# Patient Record
Sex: Male | Born: 1975 | Race: White | Hispanic: No | Marital: Married | Smoking: Never smoker
Health system: Southern US, Community
[De-identification: ages and names within clinical notes are randomized; demographics above are authoritative.]

## PROBLEM LIST (undated history)

## (undated) DIAGNOSIS — J869 Pyothorax without fistula: Secondary | ICD-10-CM

## (undated) DIAGNOSIS — G4730 Sleep apnea, unspecified: Secondary | ICD-10-CM

## (undated) DIAGNOSIS — J189 Pneumonia, unspecified organism: Secondary | ICD-10-CM

## (undated) DIAGNOSIS — Z9889 Other specified postprocedural states: Secondary | ICD-10-CM

## (undated) HISTORY — DX: Other specified postprocedural states: Z98.890

## (undated) HISTORY — DX: Pneumonia, unspecified organism: J18.9

---

## 2013-03-25 DIAGNOSIS — J189 Pneumonia, unspecified organism: Secondary | ICD-10-CM

## 2013-03-25 HISTORY — DX: Pneumonia, unspecified organism: J18.9

## 2013-05-28 ENCOUNTER — Ambulatory Visit (INDEPENDENT_AMBULATORY_CARE_PROVIDER_SITE_OTHER): Payer: BC Managed Care – PPO | Admitting: Podiatry

## 2013-05-28 ENCOUNTER — Encounter: Payer: Self-pay | Admitting: Podiatry

## 2013-05-28 ENCOUNTER — Ambulatory Visit (INDEPENDENT_AMBULATORY_CARE_PROVIDER_SITE_OTHER): Payer: BC Managed Care – PPO

## 2013-05-28 VITALS — BP 123/87 | HR 80 | Resp 16 | Ht 74.0 in | Wt 225.0 lb

## 2013-05-28 DIAGNOSIS — M79609 Pain in unspecified limb: Secondary | ICD-10-CM

## 2013-05-28 DIAGNOSIS — M79672 Pain in left foot: Secondary | ICD-10-CM

## 2013-05-28 DIAGNOSIS — M722 Plantar fascial fibromatosis: Secondary | ICD-10-CM

## 2013-05-28 MED ORDER — DICLOFENAC SODIUM 75 MG PO TBEC: 75.0000 mg | DELAYED_RELEASE_TABLET | Freq: Two times a day (BID) | ORAL | Status: DC

## 2013-05-28 MED ORDER — TRIAMCINOLONE ACETONIDE 10 MG/ML IJ SUSP
10.0000 mg | Freq: Once | INTRAMUSCULAR | Status: AC
  Administered 2013-05-28: 10 mg

## 2013-05-28 NOTE — Progress Notes (Signed)
Subjective:     Patient ID: Adrian CookeyJohn Hogan, male   DOB: 11/17/1975, 38 y.o.   MRN: [id number]  Foot Pain   patient presents stating I am having pain in my left arch that has been present for about 4 months and getting worse over that period of time. Started as a stone bruise but is now hurting me most of the day   Review of Systems  All other systems reviewed and are negative.       Objective:   Physical Exam  Nursing note and vitals reviewed. Constitutional: He is oriented to person, place, and time.  Cardiovascular: Intact distal pulses.   Musculoskeletal: Normal range of motion.  Neurological: He is oriented to person, place, and time.  Skin: Skin is warm.   neurovascular status is intact with muscle strength adequate and no equinus condition and normal range of motion. Patient is found to have discomfort in the plantar arch left just distal to the insertion to the calcaneus with inflammation and fluid of the medial band     Assessment:     Plantar fasciitis left with inflammation and fluid of the medial band    Plan:     H&P and x-ray reviewed with patient. Injected the left plantar fascia distal to the insertion 3 mg Kenalog 5 mm I can Marcaine mixture and dispensed splint in order to lift up the arch and instructed on usage. Placed on Voltaren 75 mg twice a day and also gave instructions on physical therapy for plantar fasciitis. Reappoint one week and discussed long-term orthotic therapy

## 2013-05-28 NOTE — Patient Instructions (Signed)

## 2013-05-28 NOTE — Progress Notes (Signed)
   Subjective:    Patient ID: Adrian CookeyJohn Hogan, male    DOB: 06/23/1975, 38 y.o.   MRN: [id number]  HPI Comments: Left foot having arch pain, sharp , it started when it was just doing it in the morning now it has got to the point that it will hurt throughout the day. 3-4 months . Taking some etodolac for a couple of days. Mostly just dealing with it   Foot Pain      Review of Systems  All other systems reviewed and are negative.       Objective:   Physical Exam        Assessment & Plan:

## 2013-06-04 ENCOUNTER — Ambulatory Visit (INDEPENDENT_AMBULATORY_CARE_PROVIDER_SITE_OTHER): Payer: BC Managed Care – PPO | Admitting: Podiatry

## 2013-06-04 VITALS — BP 102/68 | HR 61 | Resp 16 | Ht 74.0 in | Wt 230.0 lb

## 2013-06-04 DIAGNOSIS — M722 Plantar fascial fibromatosis: Secondary | ICD-10-CM

## 2013-06-04 NOTE — Progress Notes (Signed)
Subjective:     Patient ID: Adrian CookeyJohn Hogan, male   DOB: 11/13/1975, 38 y.o.   MRN: [id number]  HPI patient presents stating my foot feels better but it feels so good having support from the brace   Review of Systems     Objective:   Physical Exam Neurovascular status intact with pain in the mid arch region been significantly improved from previous visit    Assessment:     Plantar fasciitis improve left the chronic nature to his deformity with mechanical dysfunction of arch noted    Plan:     Reviewed condition and recommended orthotics with review of physical therapy. Scanned for custom orthotics at this time and will be seen back when they are ready

## 2013-06-17 ENCOUNTER — Encounter: Payer: Self-pay | Admitting: *Deleted

## 2013-06-17 NOTE — Progress Notes (Signed)
Sent pt post card letting him know orthotics are here. 

## 2013-06-18 ENCOUNTER — Ambulatory Visit (INDEPENDENT_AMBULATORY_CARE_PROVIDER_SITE_OTHER): Payer: BC Managed Care – PPO | Admitting: *Deleted

## 2013-06-18 DIAGNOSIS — M722 Plantar fascial fibromatosis: Secondary | ICD-10-CM

## 2013-06-18 NOTE — Patient Instructions (Signed)

## 2013-06-18 NOTE — Progress Notes (Signed)
Pt presents for orthotics pick up, written and verbal instructions given

## 2013-07-09 ENCOUNTER — Ambulatory Visit: Payer: BC Managed Care – PPO | Admitting: Podiatry

## 2013-07-16 ENCOUNTER — Ambulatory Visit: Payer: BC Managed Care – PPO | Admitting: Podiatry

## 2013-08-10 ENCOUNTER — Ambulatory Visit (INDEPENDENT_AMBULATORY_CARE_PROVIDER_SITE_OTHER): Payer: BC Managed Care – PPO | Admitting: Podiatry

## 2013-08-10 ENCOUNTER — Telehealth: Payer: Self-pay | Admitting: Podiatry

## 2013-08-10 VITALS — BP 118/74 | HR 90 | Resp 16

## 2013-08-10 DIAGNOSIS — M722 Plantar fascial fibromatosis: Secondary | ICD-10-CM

## 2013-08-10 MED ORDER — TRIAMCINOLONE ACETONIDE 10 MG/ML IJ SUSP
10.0000 mg | Freq: Once | INTRAMUSCULAR | Status: AC
Start: 2013-08-10 — End: 2013-08-10
  Administered 2013-08-10: 10 mg

## 2013-08-10 NOTE — Telephone Encounter (Signed)
PT CALLED AND THINKS THAT DR. REGAL WAS GOING TO GIVE HIM COPIES OF EXERCISES FOR HIS FEET? HE WOULD LIKE THEM EMAILED TO HIM AT [email or web address]  PLEASE

## 2013-08-10 NOTE — Progress Notes (Signed)
Subjective:     Patient ID: Dorena CookeyJohn Matthes, male   DOB: 09/20/1975, 38 y.o.   MRN: [id number]  HPI patient states I'm still having pain in my left arch that is worse after sitting or when I get up in the morning. Not as intense as before and the orthotics really help me but I cannot move them from shoe to shoe as my workboots are at work and they are difficult to get out of   Review of Systems     Objective:   Physical Exam Neurovascular status intact with no change in health history and discomfort in the proximal portion of the arch left at the plantar fascia    Assessment:     Reoccurrence of plantar fasciitis left    Plan:     Reviewed condition and today reinjected the plantar fascia 3 mg Kenalog 5 mg Xylocaine Marcaine mixture and dispensed night splint with all instructions on usage

## 2013-08-10 NOTE — Patient Instructions (Signed)

## 2013-08-17 ENCOUNTER — Ambulatory Visit: Payer: BC Managed Care – PPO | Admitting: Podiatry

## 2013-09-10 DIAGNOSIS — M722 Plantar fascial fibromatosis: Secondary | ICD-10-CM

## 2013-09-27 ENCOUNTER — Encounter: Payer: Self-pay | Admitting: Podiatry

## 2014-02-04 ENCOUNTER — Ambulatory Visit: Payer: Self-pay | Admitting: Family Medicine

## 2014-02-04 LAB — RAPID INFLUENZA A&B ANTIGENS

## 2014-02-10 ENCOUNTER — Ambulatory Visit: Payer: Self-pay | Admitting: Family Medicine

## 2014-02-10 LAB — BASIC METABOLIC PANEL
BUN: 10 mg/dL (ref 4–21)
Creatinine: 1.2 mg/dL (ref 0.6–1.3)
GLUCOSE: 91 mg/dL
POTASSIUM: 3.7 mmol/L (ref 3.4–5.3)
SODIUM: 140 mmol/L (ref 137–147)

## 2014-02-10 LAB — LIPID PANEL
Cholesterol: 177 mg/dL (ref 0–200)
HDL: 15 mg/dL — AB (ref 35–70)
LDL Cholesterol: 128 mg/dL
TRIGLYCERIDES: 169 mg/dL — AB (ref 40–160)

## 2014-02-10 LAB — TSH: TSH: 1.53 u[IU]/mL (ref 0.41–5.90)

## 2014-02-10 LAB — CBC AND DIFFERENTIAL
HEMATOCRIT: 37 % — AB (ref 41–53)
HEMOGLOBIN: 12.6 g/dL — AB (ref 13.5–17.5)
PLATELETS: 257 10*3/uL (ref 150–399)
WBC: 12.1 10^3/mL

## 2014-02-21 ENCOUNTER — Ambulatory Visit: Payer: Self-pay | Admitting: Family Medicine

## 2014-02-21 ENCOUNTER — Inpatient Hospital Stay: Payer: Self-pay

## 2014-02-21 LAB — CBC WITH DIFFERENTIAL/PLATELET
BASOS ABS: 0.1 10*3/uL (ref 0.0–0.1)
Basophil %: 0.5 %
Eosinophil #: 0.2 10*3/uL (ref 0.0–0.7)
Eosinophil %: 1 %
HCT: 35.5 % — ABNORMAL LOW (ref 40.0–52.0)
HGB: 11.4 g/dL — ABNORMAL LOW (ref 13.0–18.0)
LYMPHS ABS: 2.1 10*3/uL (ref 1.0–3.6)
Lymphocyte %: 13.4 %
MCH: 28 pg (ref 26.0–34.0)
MCHC: 32.2 g/dL (ref 32.0–36.0)
MCV: 87 fL (ref 80–100)
MONO ABS: 1.8 x10 3/mm — AB (ref 0.2–1.0)
MONOS PCT: 11.8 %
NEUTROS PCT: 73.3 %
Neutrophil #: 11.3 10*3/uL — ABNORMAL HIGH (ref 1.4–6.5)
Platelet: 519 10*3/uL — ABNORMAL HIGH (ref 150–440)
RBC: 4.08 10*6/uL — AB (ref 4.40–5.90)
RDW: 13.5 % (ref 11.5–14.5)
WBC: 15.5 10*3/uL — ABNORMAL HIGH (ref 3.8–10.6)

## 2014-02-21 LAB — CREATININE, SERUM
CREATININE: 1.02 mg/dL (ref 0.60–1.30)
EGFR (Non-African Amer.): >60

## 2014-02-22 HISTORY — PX: ESOPHAGOGASTRODUODENOSCOPY: SHX1529

## 2014-02-22 LAB — CBC WITH DIFFERENTIAL/PLATELET
Basophil #: 0.1 10*3/uL (ref 0.0–0.1)
Basophil %: 0.7 %
EOS ABS: 0.3 10*3/uL (ref 0.0–0.7)
Eosinophil %: 2.4 %
HCT: 33.8 % — ABNORMAL LOW (ref 40.0–52.0)
HGB: 11 g/dL — ABNORMAL LOW (ref 13.0–18.0)
LYMPHS PCT: 15.8 %
Lymphocyte #: 2 10*3/uL (ref 1.0–3.6)
MCH: 28.8 pg (ref 26.0–34.0)
MCHC: 32.7 g/dL (ref 32.0–36.0)
MCV: 88 fL (ref 80–100)
MONO ABS: 1.5 x10 3/mm — AB (ref 0.2–1.0)
MONOS PCT: 11.8 %
NEUTROS PCT: 69.3 %
Neutrophil #: 8.6 10*3/uL — ABNORMAL HIGH (ref 1.4–6.5)
Platelet: 434 10*3/uL (ref 150–440)
RBC: 3.84 10*6/uL — ABNORMAL LOW (ref 4.40–5.90)
RDW: 13.5 % (ref 11.5–14.5)
WBC: 12.4 10*3/uL — ABNORMAL HIGH (ref 3.8–10.6)

## 2014-02-22 LAB — BASIC METABOLIC PANEL
ANION GAP: 6 — AB (ref 7–16)
BUN: 8 mg/dL (ref 7–18)
CHLORIDE: 101 mmol/L (ref 98–107)
CREATININE: 1.14 mg/dL (ref 0.60–1.30)
Calcium, Total: 8.7 mg/dL (ref 8.5–10.1)
Co2: 32 mmol/L (ref 21–32)
Glucose: 90 mg/dL (ref 65–99)
Osmolality: 275 (ref 275–301)
POTASSIUM: 3.9 mmol/L (ref 3.5–5.1)
SODIUM: 139 mmol/L (ref 136–145)

## 2014-02-22 LAB — RAPID HIV SCREEN (HIV 1/2 AB+AG)

## 2014-02-24 LAB — CBC WITH DIFFERENTIAL/PLATELET
EOS PCT: 1 %
HCT: 36 % — AB (ref 40.0–52.0)
HGB: 11.7 g/dL — ABNORMAL LOW (ref 13.0–18.0)
LYMPHS PCT: 12 %
MCH: 28.2 pg (ref 26.0–34.0)
MCHC: 32.6 g/dL (ref 32.0–36.0)
MCV: 87 fL (ref 80–100)
MONOS PCT: 6 %
Platelet: 477 10*3/uL — ABNORMAL HIGH (ref 150–440)
RBC: 4.15 10*6/uL — ABNORMAL LOW (ref 4.40–5.90)
RDW: 13.9 % (ref 11.5–14.5)
Segmented Neutrophils: 81 %
WBC: 26.7 10*3/uL — AB (ref 3.8–10.6)

## 2014-02-24 LAB — LACTATE DEHYDROGENASE, PLEURAL OR PERITONEAL FLUID: LDH, Body Fluid: 2028 U/L

## 2014-02-24 LAB — VANCOMYCIN, TROUGH: Vancomycin, Trough: 22 ug/mL (ref 10–20)

## 2014-02-24 LAB — GLUCOSE, SEROUS FLUID: Glucose, Body Fluid: <5 mg/dL — ABNORMAL LOW

## 2014-02-24 LAB — CREATININE, SERUM
Creatinine: 1.43 mg/dL — ABNORMAL HIGH (ref 0.60–1.30)
EGFR (Non-African Amer.): 59 — ABNORMAL LOW

## 2014-02-25 LAB — CBC WITH DIFFERENTIAL/PLATELET
Basophil #: 0.1 10*3/uL (ref 0.0–0.1)
Basophil %: 0.4 %
Eosinophil #: 0.6 10*3/uL (ref 0.0–0.7)
Eosinophil %: 2.6 %
HCT: 37.1 % — ABNORMAL LOW (ref 40.0–52.0)
HGB: 11.9 g/dL — ABNORMAL LOW (ref 13.0–18.0)
LYMPHS PCT: 6.5 %
Lymphocyte #: 1.5 10*3/uL (ref 1.0–3.6)
MCH: 28.2 pg (ref 26.0–34.0)
MCHC: 32.2 g/dL (ref 32.0–36.0)
MCV: 88 fL (ref 80–100)
Monocyte #: 2.4 x10 3/mm — ABNORMAL HIGH (ref 0.2–1.0)
Monocyte %: 10.3 %
NEUTROS ABS: 18.3 10*3/uL — AB (ref 1.4–6.5)
Neutrophil %: 80.2 %
Platelet: 485 10*3/uL — ABNORMAL HIGH (ref 150–440)
RBC: 4.24 10*6/uL — ABNORMAL LOW (ref 4.40–5.90)
RDW: 14.2 % (ref 11.5–14.5)
WBC: 22.8 10*3/uL — ABNORMAL HIGH (ref 3.8–10.6)

## 2014-02-25 LAB — CREATININE, SERUM
Creatinine: 2.01 mg/dL — ABNORMAL HIGH (ref 0.60–1.30)
EGFR (Non-African Amer.): 40 — ABNORMAL LOW
GFR CALC AF AMER: 48 — AB

## 2014-02-25 LAB — VANCOMYCIN, RANDOM: VANCOMYCIN, RANDOM: 14 ug/mL

## 2014-02-25 LAB — PROTIME-INR
INR: 1.1
Prothrombin Time: 14.1 secs (ref 11.5–14.7)

## 2014-02-25 LAB — APTT: ACTIVATED PTT: 31.6 s (ref 23.6–35.9)

## 2014-02-26 LAB — BASIC METABOLIC PANEL
Anion Gap: 3 — ABNORMAL LOW (ref 7–16)
BUN: 9 mg/dL (ref 7–18)
Calcium, Total: 8.6 mg/dL (ref 8.5–10.1)
Chloride: 106 mmol/L (ref 98–107)
Co2: 30 mmol/L (ref 21–32)
Creatinine: 2.08 mg/dL — ABNORMAL HIGH (ref 0.60–1.30)
GFR CALC AF AMER: 47 — AB
GFR CALC NON AF AMER: 38 — AB
GLUCOSE: 115 mg/dL — AB (ref 65–99)
OSMOLALITY: 277 (ref 275–301)
Potassium: 4.5 mmol/L (ref 3.5–5.1)
Sodium: 139 mmol/L (ref 136–145)

## 2014-02-26 LAB — BRONCHIAL WASH CULTURE

## 2014-02-26 LAB — CULTURE, BLOOD (SINGLE)

## 2014-02-26 LAB — VANCOMYCIN, TROUGH: Vancomycin, Trough: 18 ug/mL (ref 10–20)

## 2014-02-27 LAB — CREATININE, SERUM
Creatinine: 1.97 mg/dL — ABNORMAL HIGH (ref 0.60–1.30)
EGFR (African American): 50 — ABNORMAL LOW
EGFR (Non-African Amer.): 41 — ABNORMAL LOW

## 2014-02-28 HISTORY — PX: THORACOTOMY: SUR1349

## 2014-02-28 LAB — CBC WITH DIFFERENTIAL/PLATELET
BASOS PCT: 0.5 %
Basophil #: 0.1 10*3/uL (ref 0.0–0.1)
EOS ABS: 0.6 10*3/uL (ref 0.0–0.7)
EOS PCT: 4.6 %
HCT: 32.2 % — ABNORMAL LOW (ref 40.0–52.0)
HGB: 10.6 g/dL — ABNORMAL LOW (ref 13.0–18.0)
LYMPHS PCT: 14 %
Lymphocyte #: 1.8 10*3/uL (ref 1.0–3.6)
MCH: 28.4 pg (ref 26.0–34.0)
MCHC: 32.8 g/dL (ref 32.0–36.0)
MCV: 87 fL (ref 80–100)
MONOS PCT: 12.3 %
Monocyte #: 1.6 x10 3/mm — ABNORMAL HIGH (ref 0.2–1.0)
NEUTROS ABS: 9 10*3/uL — AB (ref 1.4–6.5)
Neutrophil %: 68.6 %
Platelet: 422 10*3/uL (ref 150–440)
RBC: 3.72 10*6/uL — AB (ref 4.40–5.90)
RDW: 14 % (ref 11.5–14.5)
WBC: 13.2 10*3/uL — ABNORMAL HIGH (ref 3.8–10.6)

## 2014-02-28 LAB — COMPREHENSIVE METABOLIC PANEL
ALK PHOS: 113 U/L
Albumin: 1.7 g/dL — ABNORMAL LOW (ref 3.4–5.0)
Anion Gap: 8 (ref 7–16)
BUN: 4 mg/dL — ABNORMAL LOW (ref 7–18)
Bilirubin,Total: 0.4 mg/dL (ref 0.2–1.0)
CALCIUM: 8.6 mg/dL (ref 8.5–10.1)
Chloride: 104 mmol/L (ref 98–107)
Co2: 30 mmol/L (ref 21–32)
Glucose: 135 mg/dL — ABNORMAL HIGH (ref 65–99)
OSMOLALITY: 282 (ref 275–301)
POTASSIUM: 3.5 mmol/L (ref 3.5–5.1)
SGOT(AST): 14 U/L — ABNORMAL LOW (ref 15–37)
SGPT (ALT): 20 U/L
Sodium: 142 mmol/L (ref 136–145)
Total Protein: 6 g/dL — ABNORMAL LOW (ref 6.4–8.2)

## 2014-02-28 LAB — CREATININE, SERUM
Creatinine: 1.92 mg/dL — ABNORMAL HIGH (ref 0.60–1.30)
EGFR (Non-African Amer.): 42 — ABNORMAL LOW
GFR CALC AF AMER: 51 — AB

## 2014-02-28 LAB — BODY FLUID CULTURE

## 2014-03-01 LAB — COMPREHENSIVE METABOLIC PANEL
ALBUMIN: 1.7 g/dL — AB (ref 3.4–5.0)
ALT: 21 U/L
Alkaline Phosphatase: 101 U/L
Anion Gap: 5 — ABNORMAL LOW (ref 7–16)
BUN: 8 mg/dL (ref 7–18)
Bilirubin,Total: 0.2 mg/dL (ref 0.2–1.0)
CO2: 30 mmol/L (ref 21–32)
Calcium, Total: 8.5 mg/dL (ref 8.5–10.1)
Chloride: 106 mmol/L (ref 98–107)
Creatinine: 1.61 mg/dL — ABNORMAL HIGH (ref 0.60–1.30)
EGFR (African American): >60
EGFR (Non-African Amer.): 52 — ABNORMAL LOW
Glucose: 171 mg/dL — ABNORMAL HIGH (ref 65–99)
OSMOLALITY: 284 (ref 275–301)
Potassium: 3.7 mmol/L (ref 3.5–5.1)
SGOT(AST): 23 U/L (ref 15–37)
Sodium: 141 mmol/L (ref 136–145)
Total Protein: 6.3 g/dL — ABNORMAL LOW (ref 6.4–8.2)

## 2014-03-01 LAB — CBC WITH DIFFERENTIAL/PLATELET
Basophil #: 0 10*3/uL (ref 0.0–0.1)
Basophil %: 0.1 %
EOS ABS: 0 10*3/uL (ref 0.0–0.7)
Eosinophil %: 0 %
HCT: 32.5 % — ABNORMAL LOW (ref 40.0–52.0)
HGB: 10.5 g/dL — ABNORMAL LOW (ref 13.0–18.0)
Lymphocyte #: 1.5 10*3/uL (ref 1.0–3.6)
Lymphocyte %: 6.2 %
MCH: 27.9 pg (ref 26.0–34.0)
MCHC: 32.3 g/dL (ref 32.0–36.0)
MCV: 87 fL (ref 80–100)
Monocyte #: 1.2 x10 3/mm — ABNORMAL HIGH (ref 0.2–1.0)
Monocyte %: 5 %
NEUTROS ABS: 21.7 10*3/uL — AB (ref 1.4–6.5)
NEUTROS PCT: 88.7 %
PLATELETS: 474 10*3/uL — AB (ref 150–440)
RBC: 3.76 10*6/uL — ABNORMAL LOW (ref 4.40–5.90)
RDW: 14.3 % (ref 11.5–14.5)
WBC: 24.5 10*3/uL — AB (ref 3.8–10.6)

## 2014-03-02 LAB — CBC WITH DIFFERENTIAL/PLATELET
Basophil #: 0.1 10*3/uL (ref 0.0–0.1)
Basophil %: 0.8 %
EOS ABS: 0.4 10*3/uL (ref 0.0–0.7)
EOS PCT: 2.3 %
HCT: 31.2 % — ABNORMAL LOW (ref 40.0–52.0)
HGB: 9.9 g/dL — AB (ref 13.0–18.0)
Lymphocyte #: 2.3 10*3/uL (ref 1.0–3.6)
Lymphocyte %: 14.5 %
MCH: 27.8 pg (ref 26.0–34.0)
MCHC: 31.7 g/dL — ABNORMAL LOW (ref 32.0–36.0)
MCV: 88 fL (ref 80–100)
Monocyte #: 1.3 x10 3/mm — ABNORMAL HIGH (ref 0.2–1.0)
Monocyte %: 8.4 %
Neutrophil #: 11.7 10*3/uL — ABNORMAL HIGH (ref 1.4–6.5)
Neutrophil %: 74 %
Platelet: 438 10*3/uL (ref 150–440)
RBC: 3.56 10*6/uL — ABNORMAL LOW (ref 4.40–5.90)
RDW: 14.2 % (ref 11.5–14.5)
WBC: 15.9 10*3/uL — ABNORMAL HIGH (ref 3.8–10.6)

## 2014-03-02 LAB — BASIC METABOLIC PANEL
Anion Gap: 7 (ref 7–16)
BUN: 10 mg/dL (ref 7–18)
CALCIUM: 8.1 mg/dL — AB (ref 8.5–10.1)
CO2: 31 mmol/L (ref 21–32)
CREATININE: 1.59 mg/dL — AB (ref 0.60–1.30)
Chloride: 105 mmol/L (ref 98–107)
EGFR (African American): >60
EGFR (Non-African Amer.): 52 — ABNORMAL LOW
GLUCOSE: 107 mg/dL — AB (ref 65–99)
Osmolality: 284 (ref 275–301)
Potassium: 3.7 mmol/L (ref 3.5–5.1)
SODIUM: 143 mmol/L (ref 136–145)

## 2014-03-04 LAB — CBC WITH DIFFERENTIAL/PLATELET
Basophil #: 0.1 10*3/uL (ref 0.0–0.1)
Basophil %: 0.5 %
Eosinophil #: 0.5 10*3/uL (ref 0.0–0.7)
Eosinophil %: 4.5 %
HCT: 33.7 % — AB (ref 40.0–52.0)
HGB: 10.7 g/dL — ABNORMAL LOW (ref 13.0–18.0)
LYMPHS ABS: 1.9 10*3/uL (ref 1.0–3.6)
LYMPHS PCT: 15.8 %
MCH: 27.6 pg (ref 26.0–34.0)
MCHC: 31.8 g/dL — ABNORMAL LOW (ref 32.0–36.0)
MCV: 87 fL (ref 80–100)
Monocyte #: 0.9 x10 3/mm (ref 0.2–1.0)
Monocyte %: 7.3 %
NEUTROS ABS: 8.7 10*3/uL — AB (ref 1.4–6.5)
NEUTROS PCT: 71.9 %
Platelet: 524 10*3/uL — ABNORMAL HIGH (ref 150–440)
RBC: 3.88 10*6/uL — ABNORMAL LOW (ref 4.40–5.90)
RDW: 14.8 % — ABNORMAL HIGH (ref 11.5–14.5)
WBC: 12.1 10*3/uL — AB (ref 3.8–10.6)

## 2014-03-04 LAB — BASIC METABOLIC PANEL
Anion Gap: 5 — ABNORMAL LOW (ref 7–16)
BUN: 9 mg/dL (ref 7–18)
CALCIUM: 8.8 mg/dL (ref 8.5–10.1)
CHLORIDE: 103 mmol/L (ref 98–107)
CREATININE: 1.76 mg/dL — AB (ref 0.60–1.30)
Co2: 35 mmol/L — ABNORMAL HIGH (ref 21–32)
EGFR (African American): 56 — ABNORMAL LOW
GFR CALC NON AF AMER: 47 — AB
GLUCOSE: 106 mg/dL — AB (ref 65–99)
Osmolality: 284 (ref 275–301)
Potassium: 3.3 mmol/L — ABNORMAL LOW (ref 3.5–5.1)
Sodium: 143 mmol/L (ref 136–145)

## 2014-03-04 LAB — MISC AER/ANAEROBIC CULT.

## 2014-03-05 LAB — PROTEIN URINE, QUAL: Protein: NEGATIVE

## 2014-03-05 LAB — URINALYSIS, COMPLETE
BACTERIA: NONE SEEN
BILIRUBIN, UR: NEGATIVE
BLOOD: NEGATIVE
Glucose,UR: NEGATIVE mg/dL (ref 0–75)
Ketone: NEGATIVE
Leukocyte Esterase: NEGATIVE
Nitrite: NEGATIVE
PH: 7 (ref 4.5–8.0)
RBC, UR: NONE SEEN /HPF (ref 0–5)
SPECIFIC GRAVITY: 1.003 (ref 1.003–1.030)
Squamous Epithelial: <1

## 2014-03-05 LAB — CLOSTRIDIUM DIFFICILE(ARMC)

## 2014-03-05 LAB — CREATININE, URINE, RANDOM: CREATININE, URINE RANDOM: 25.5 mg/dL — AB (ref 30.0–125.0)

## 2014-03-05 LAB — CREATININE, SERUM
Creatinine: 1.9 mg/dL — ABNORMAL HIGH (ref 0.60–1.30)
EGFR (African American): 52 — ABNORMAL LOW
EGFR (Non-African Amer.): 43 — ABNORMAL LOW

## 2014-03-05 LAB — VANCOMYCIN, RANDOM: Vancomycin, Random: <1 ug/mL — ABNORMAL LOW

## 2014-03-06 LAB — CBC WITH DIFFERENTIAL/PLATELET
BASOS ABS: 0.1 10*3/uL (ref 0.0–0.1)
BASOS PCT: 0.9 %
EOS ABS: 0.5 10*3/uL (ref 0.0–0.7)
Eosinophil %: 5 %
HCT: 30.8 % — AB (ref 40.0–52.0)
HGB: 10 g/dL — AB (ref 13.0–18.0)
LYMPHS PCT: 22.1 %
Lymphocyte #: 2.3 10*3/uL (ref 1.0–3.6)
MCH: 27.9 pg (ref 26.0–34.0)
MCHC: 32.5 g/dL (ref 32.0–36.0)
MCV: 86 fL (ref 80–100)
MONOS PCT: 8.9 %
Monocyte #: 0.9 x10 3/mm (ref 0.2–1.0)
Neutrophil #: 6.5 10*3/uL (ref 1.4–6.5)
Neutrophil %: 63.1 %
Platelet: 520 10*3/uL — ABNORMAL HIGH (ref 150–440)
RBC: 3.58 10*6/uL — AB (ref 4.40–5.90)
RDW: 14.2 % (ref 11.5–14.5)
WBC: 10.3 10*3/uL (ref 3.8–10.6)

## 2014-03-06 LAB — BASIC METABOLIC PANEL
ANION GAP: 4 — AB (ref 7–16)
BUN: 7 mg/dL (ref 7–18)
CREATININE: 1.91 mg/dL — AB (ref 0.60–1.30)
Calcium, Total: 8.5 mg/dL (ref 8.5–10.1)
Chloride: 107 mmol/L (ref 98–107)
Co2: 31 mmol/L (ref 21–32)
EGFR (African American): 51 — ABNORMAL LOW
EGFR (Non-African Amer.): 42 — ABNORMAL LOW
GLUCOSE: 102 mg/dL — AB (ref 65–99)
OSMOLALITY: 281 (ref 275–301)
POTASSIUM: 3.7 mmol/L (ref 3.5–5.1)
SODIUM: 142 mmol/L (ref 136–145)

## 2014-03-07 LAB — CREATININE, SERUM
Creatinine: 2.1 mg/dL — ABNORMAL HIGH (ref 0.60–1.30)
EGFR (African American): 46 — ABNORMAL LOW
GFR CALC NON AF AMER: 38 — AB

## 2014-03-07 LAB — STOOL CULTURE

## 2014-03-08 ENCOUNTER — Ambulatory Visit: Payer: Self-pay | Admitting: Nephrology

## 2014-03-08 LAB — RENAL FUNCTION PANEL
Albumin: 2.5 g/dL — ABNORMAL LOW (ref 3.4–5.0)
Anion Gap: 4 — ABNORMAL LOW (ref 7–16)
BUN: 7 mg/dL (ref 7–18)
CHLORIDE: 101 mmol/L (ref 98–107)
CREATININE: 1.96 mg/dL — AB (ref 0.60–1.30)
Calcium, Total: 9.5 mg/dL (ref 8.5–10.1)
Co2: 33 mmol/L — ABNORMAL HIGH (ref 21–32)
EGFR (African American): 50 — ABNORMAL LOW
GFR CALC NON AF AMER: 41 — AB
Glucose: 114 mg/dL — ABNORMAL HIGH (ref 65–99)
OSMOLALITY: 275 (ref 275–301)
PHOSPHORUS: 4.8 mg/dL (ref 2.5–4.9)
Potassium: 4.3 mmol/L (ref 3.5–5.1)
SODIUM: 138 mmol/L (ref 136–145)

## 2014-03-10 ENCOUNTER — Ambulatory Visit: Payer: Self-pay | Admitting: Cardiothoracic Surgery

## 2014-03-10 ENCOUNTER — Ambulatory Visit: Payer: Self-pay | Admitting: Nephrology

## 2014-03-10 LAB — RENAL FUNCTION PANEL
ALBUMIN: 2.7 g/dL — AB (ref 3.4–5.0)
ANION GAP: 6 — AB (ref 7–16)
BUN: 10 mg/dL (ref 7–18)
CO2: 30 mmol/L (ref 21–32)
Calcium, Total: 9.5 mg/dL (ref 8.5–10.1)
Chloride: 101 mmol/L (ref 98–107)
Creatinine: 2 mg/dL — ABNORMAL HIGH (ref 0.60–1.30)
EGFR (African American): 49 — ABNORMAL LOW
GFR CALC NON AF AMER: 40 — AB
Glucose: 124 mg/dL — ABNORMAL HIGH (ref 65–99)
Osmolality: 274 (ref 275–301)
Phosphorus: 4.8 mg/dL (ref 2.5–4.9)
Potassium: 3.9 mmol/L (ref 3.5–5.1)
Sodium: 137 mmol/L (ref 136–145)

## 2014-03-16 LAB — CULTURE, FUNGUS WITHOUT SMEAR

## 2014-03-21 LAB — CULTURE, FUNGUS WITHOUT SMEAR

## 2014-03-25 ENCOUNTER — Ambulatory Visit: Payer: Self-pay | Admitting: Cardiothoracic Surgery

## 2014-03-25 HISTORY — PX: VASECTOMY: SHX75

## 2014-04-25 ENCOUNTER — Ambulatory Visit: Payer: Self-pay | Admitting: Cardiothoracic Surgery

## 2014-05-24 ENCOUNTER — Ambulatory Visit
Admit: 2014-05-24 | Disposition: A | Discharge status: Home / Self Care | Payer: Self-pay | Attending: Cardiothoracic Surgery | Admitting: Cardiothoracic Surgery

## 2014-06-24 ENCOUNTER — Ambulatory Visit
Admit: 2014-06-24 | Disposition: A | Discharge status: Home / Self Care | Payer: Self-pay | Attending: Cardiothoracic Surgery | Admitting: Cardiothoracic Surgery

## 2014-07-16 NOTE — Consult Note (Signed)
PATIENT NAME:  Adrian LennertHAYES, Damire R MR#:  [id number] DATE OF BIRTH:  1975/06/14  DATE OF CONSULTATION:  02/23/2014  REFERRING PHYSICIAN:   CONSULTING PHYSICIAN:  Keturah Barrehristiane H. Madysin Crisp, NP  REASON FOR CONSULTATION: GI consult ordered by Dr. Elisabeth PigeonVachhani for evaluation of aspiration pneumonia.   HISTORY OF PRESENT ILLNESS: I appreciate consult for 39 year old, otherwise healthy man admitted with pneumonia/right lung cavitary lesion for evaluation of possible aspiration after bronchoscopy today revealed gastric contents in his lung. The patient does report history of dysphagia with meat/rice/similar foods over the last 18 months that has been increasing lately. States no nausea outside of this current illness, sometimes has vomited to clear his throat in the past. Reports a black tarry stool some months ago, but none recently. States he has not had any of this evaluated yet. Denies dyspepsia, abdominal pain, hematochezia, further GI-related complaints. States he takes no NSAIDs. No history of luminal evaluation.   PAST MEDICAL HISTORY: Denies chronic health problems.   PAST MEDICAL HISTORY: Prior surgeries.   ALLERGIES: NKDA.   SOCIAL HISTORY: Lives with wife. No smoking, EtOH, illicits.   FAMILY HISTORY: No history of colorectal cancer, colon polyps, liver disease, ulcers, esophageal or pancreatic cancer.   MEDICATIONS: No regular medications.  REVIEW OF SYSTEMS: Ten systems reviewed. Significant for fatigue, fever, cough with production of phlegm, otherwise unremarkable other than what is noted above as well as fatigue.   MOST RECENT LABS: normal. WBC 12.4, hemoglobin 11, hematocrit 33.8, platelet count 434,000, red cells normocytic, HIV negative.  Did have chest x-ray. Chest CT demonstrating large, mildly complex abscess of the anterior aspect of the right lower lung lobe measuring 8.5 x 7.5 x 5.5 cm and an enlarged esophageal recess noted reflecting the abscess.   PHYSICAL EXAMINATION: VITAL SIGNS:  Most recent vital signs: Temperature 98.2, pulse 100, respiratory rate 20, blood pressure 111/68, sa02 96% on room air.  GENERAL: Well-appearing young man, lying in bed, in no acute distress, somewhat drowsy. Wife reports he has recently had pain medication.  HEENT: Normocephalic, atraumatic. Conjunctivae pink. Sclerae are clear.  NECK: Supple. No thyromegaly or JVD.  LUNGS: Respirations eupneic, slightly decreased to the right lower lobe, otherwise lungs clear.  CARDIAC: S1, S2, RRR. No MRG. No appreciable edema.  ABDOMEN: Flat, bowel sounds x4. Soft, nondistended, nontender. No hepatosplenomegaly, masses, guarding, rigidity, or other peritoneal signs.  SKIN: Warm, dry, pink. No erythema, lesion, or rash.  EXTREMITIES: MAEW x4. No cyanosis or clubbing.  NEUROLOGICAL: Alert, oriented x3. Cranial nerves II through XII intact. Speech clear. No facial droop.  PSYCHIATRIC: Pleasant, calm, cooperative.   IMPRESSION AND PLAN: Dysphagia, aspiration. Would avoid NSAIDs for now and ensure proton pump inhibitor daily. Schedule esophagogastroduodenoscopy for luminal evaluation and possible dilation sometime after thoracentesis tomorrow, for which he is scheduled as clinically feasible. Procedure information given. Indications, benefits, risks including, but not limited to, bleeding, infection, perforation, difficulty with sedation were discussed with the patient and he is agreeable to the procedures.  Thank you very much for this consult. These services were provided by Vevelyn Pathristiane Jan Olano, MSN, [facility], in collaboration with Lutricia FeilPaul Oh, MD, with whom I discussed this patient in full.   ____________________________ Keturah Barrehristiane H. Kenley Rettinger, NP chl:sw D: 02/23/2014 15:18:03 ET T: 02/23/2014 15:35:01 ET JOB#: [id number]  cc: Keturah Barrehristiane H. Aislin Onofre, NP, <Dictator> Eustaquio MaizeHRISTIANE H Carleen Rhue FNP ELECTRONICALLY SIGNED 03/09/2014 18:12

## 2014-07-16 NOTE — H&P (Signed)
PATIENT NAME:  Adrian Hogan, Adrian Hogan MR#:  [id number] DATE OF BIRTH:  [birth year]  DATE OF ADMISSION:  02/21/2014  PRIMARY PHYSICIAN: Demetrios Isaacsonald E. Sherrie MustacheFisher, MD  CHIEF COMPLAINT: Persistent pneumonia.   HISTORY OF PRESENT ILLNESS: A 39 year old male patient sent in from Dr. Theodis AguasFisher's office as a direct admitted because of persistent pneumonia. The patient started to have flu-like symptoms on November 12th and he was started on p.o. Levaquin on November 19th. He took Levaquin 750 mg for 5 days and went to follow up with Dr. Sherrie MustacheFisher today. The patient noted to have fever 102 since Saturday and stayed around 102 Fahrenheit since Saturday, associated with productive cough and green phlegm and some weight loss of 10 pounds in the last 1 week. The patient also is tachycardic with heart rate of 110 to 116 beats. Concerning his persistent fevers and shortness of breath and persistent pneumonia, the patient was sent in for direct admission. The patient said that Dr. Sherrie MustacheFisher did a repeat x-ray today, which showed a questionable abscess in the right lung. The patient denies any trouble breathing, O2 saturation is 93% on room air.   PAST MEDICAL HISTORY: No history of hypertension or diabetes. The patient finished Tamiflu for flu symptoms.  ALLERGIES: No known allergies.   SOCIAL HISTORY: No smoking, no drinking, no drugs.   PAST SURGICAL HISTORY: None.   FAMILY HISTORY: No hypertension or diabetes.   MEDICATIONS: None at this time. He takes Percocet 5/325 mg q. 6 hours p.Hogan.n. for pain. This was just started by Dr. Sherrie MustacheFisher for pleuritic chest pain.   REVIEW OF SYSTEMS:  CONSTITUTIONAL: The patient has some fatigue and also fever for the past few days.  EARS, NOSE, AND THROAT: Denies any ear pain. No epistaxis. No difficulty swallowing.  CARDIOVASCULAR: No chest pain. No nausea. No palpitations.  PULMONARY: Complaints of cough with productive phlegm going on for more than a week.  GASTROINTESTINAL: Denies any constipation  or diarrhea. No nausea. No vomiting.  GENITOURINARY: No dysuria.  NEUROLOGIC: No history of strokes or TIAs.   PHYSICAL EXAMINATION:  VITAL SIGNS: Temperature 99.6, heart rate 110, blood pressure 133/79, saturation 98% on room air.  GENERAL: The patient is alert, awake, oriented.  HEAD: Normocephalic, atraumatic.  EYES: Pupils equal, reacting to light. Extraocular movements intact.  EARS, NOSE, AND THROAT: No tympanic membrane congestion. No turbinate hypertrophy. No oropharyngeal erythema. NECK: Supple. No JVD.no carotid bruit.  RESPIRATIONS: Bilateral breath sounds present. Slight expiratory wheeze in the right lower lobe present. Not using accessory muscles for respiration. CARDIOVASCULAR: S1, S2 regular, slightly tachycardic. No murmurs. No extremity edema. No JVD, no carotid bruit.  ABDOMEN: Soft, nontender, nondistended. Bowel sounds present. No organomegaly. No hernias.  EXTREMITIES: No extremity edema. No cyanosis. No clubbing.  SKIN: Inspection is normal.  NEUROLOGIC: Cranial nerves II-XII are intact. Power 5/5 upper and lower extremities. Sensation intact. DTRs 2+ bilaterally. PSYCHIATRIC: Mood and affect are within normal limits.   LABORATORY DATA: The patient is a direct admit, no laboratories are available at this time. I have ordered a stat CBC, BMP and also CT of the chest. The patient's x-ray chest done from Dr. Theodis AguasFisher's office shows right lower lobe infiltrate, prominent cavitary lesion in the right lower lobe present, possible pulmonary abscess.    ASSESSMENT AND PLAN: A 39 year old male patient with light lower lobe pneumonia with persistent fevers and cough with possible right lower lobe abscess. I will get a CAT scan of the chest for him and start him on  vancomycin and Zosyn, and follow the CT chest results, and obtain pulmonary consult depending on the results.   The patient will get some inhalers to help with bronchodilation and will follow clinical course.  TIME  SPENT: About 55 minutes.    ____________________________ Katha Hamming, MD sk:TT D: 02/21/2014 16:25:45 ET T: 02/21/2014 16:51:01 ET JOB#: [id number]  cc: Katha Hamming, MD, <Dictator> Katha Hamming MD ELECTRONICALLY SIGNED 03/08/2014 23:17

## 2014-07-16 NOTE — Consult Note (Signed)
Pt seen and examined. Please see C. London's notes. Pt with aspiration pneumonia/lunhg abscess. On Abx. For thoracentesis tomorrow. Pt with chronic dysphagia, mainly to solids. Will plan EGD with dilation tomorrow afternoon if thoracentesis can be done in AM. Thanks.  Electronic Signatures: Lutricia Feilh, Sheilia Reznick (MD)  (Signed on 02-Dec-15 14:36)  Authored  Last Updated: 02-Dec-15 14:36 by Lutricia Feilh, Swanson Farnell (MD)

## 2014-07-16 NOTE — Consult Note (Signed)
Brief Consult Note: Diagnosis: pneumonia.   Patient was seen by consultant.   Consult note dictated.   Comments: Appreciate consult for 39 y/o otherwise healthy man, admitted with pneumonia/right lung cavitary lesion, for evaluation of possible aspiration after bronchoscopy today revealed gastric contents in his lung. Patient does report  history of dysphagia with meats/rice/similar foods over the last 3562m that has been increasing lately.  States no nausea outside of this current illness. Sometiems has vomited to clear his throat. Reports a black tarry stool some months ago but none recently. States he has not had this evaluated yet. Denies dyspepsia, abdominal pain, hematochezia, further GI related complaints. States he takes no NSAIDs.  No history of luminal evaluation.  Impression and plan: Dysphagia. Aspiration. Would avoid NSAIDs for now and ensure PPI daily. Schedule EGD for luminal evaluation, possible dilation, sometime after thoracentesis tomorrow, as clinically feasible. Procedure information given: indications, benefits, risks- including but not limited to bleeding, infection, perforation, difficulty with sedation, were discussed with the patient and he is agreeable to the procedures..  Electronic Signatures: Vevelyn PatLondon, Cy Bresee H (NP)  (Signed 02-Dec-15 14:34)  Authored: Brief Consult Note   Last Updated: 02-Dec-15 14:34 by Keturah BarreLondon, Johnnie Moten H (NP)

## 2014-07-16 NOTE — Consult Note (Signed)
Underwent thoracentesis earlier today. EGD showed mild esophagitis. No obvious stricture. Nevertheless, esophagus dilated to 54 Fr. Soft diet ordered. Continue daily protonix. thanks.  Electronic Signatures: Lutricia Feilh, Shaleah Nissley (MD)  (Signed on 03-Dec-15 13:40)  Authored  Last Updated: 03-Dec-15 13:40 by Lutricia Feilh, Lavonta Tillis (MD)

## 2014-07-16 NOTE — Consult Note (Signed)
Brief Consult Note: Diagnosis: RLL abscess.   Patient was seen by consultant.   Consult note dictated.   Discussed with Attending MD.   Comments: CT or US guided thoracentesis to rule out empyema.  Electronic Signatures: Jasmine Decemberaks, Onesimo Lingard E (MD)  (Signed 02-Dec-15 15:00)  Authored: Brief Consult Note   Last Updated: 02-Dec-15 15:00 by Jasmine Decemberaks, Makalia Bare E (MD)

## 2014-07-16 NOTE — Discharge Summary (Signed)
PATIENT NAME:  Adrian Hogan, Adrian Hogan DATE OF BIRTH:  22-Apr-1975  DATE OF ADMISSION:  02/21/2014 DATE OF DISCHARGE:  03/07/2014  ADMITTING DIAGNOSES: Pneumonia.   DISCHARGE DIAGNOSES:  1.  Acute respiratory failure with hypoxia.  2.  Right lung abscess with empyema, status post bronchoalveolar lavage. Thoracentesis, status post right chest tube placement on 02/25/2014 due to loculated effusion. Right lung decortication and chest tube placement on 02/28/2014, and now chest tube is to gravity.  3.  Sepsis due to above. 4.  Dysphagia, status post esophagogastroduodenoscopy on 02/24/2014 by Dr. Bluford Kaufmannh; LA grade A esophagitis noted, dilated.  5.  Acute renal failure.  6.  Acute on chronic anemia. 7.  Thrombocytosis.  8.  Hypokalemia. 9.  Constipation and later diarrhea with no Clostridium difficile noted.   DISCHARGE CONDITION: Stable.   DISCHARGE MEDICATIONS: The patient is to continue Tussionex 5 mL every 12 hours as needed, oxycodone 10 mg p.o. every 8 hours, oxycodone 15 mg every 4 hours as needed, benzonatate 100 mg every 6 hours as needed, Colace 100 mg twice daily, senna 8.6 mg once daily at bedtime as needed, omeprazole 20 mg p.o. daily and moxifloxacin 400 mg every 24 hours, length to be determined by Dr. Sampson GoonFitzgerald. Lactobacillus acidophilus oral capsule, 1 capsule twice daily.  DISCHARGE INSTRUCTIONS: The patient was advised to cleanse his skin around the tubes with soap and water, place gauze and secure with the tape.   HOME OXYGEN: None.   DIET: Regular, regular consistency.   ACTIVITY LIMITATIONS: As tolerated.   FOLLOWUP APPOINTMENT: With Dr. Thelma Bargeaks in 2 days after discharge; Dr. Sampson GoonFitzgerald in 1 week after discharge; Dr. Wynelle LinkKolluru in 1 week after discharge; and Dr. Benancio Deedshauvin in 2 days after discharge.   HOSPITAL COURSE: Please refer to interim discharge summary dictated by Dr. Elpidio AnisSudini on 03/04/2014.  In regard to acute respiratory failure, the patient presents with respiratory  failure which was felt to be due to pneumonia. The patient did have BAL as well as thoracentesis; however, his blood cultures as well as sputum cultures were negative for any bacteria. The patient did have right chest tube placed on 02/25/2014 due to loculated effusion. Later, right lung decortication was performed by Dr. Thelma Bargeaks and chest tube was placed on the 02/28/2014. The patient's chest tube now is to gravity drainage and dressings were recommended as prior mentioned. He is to continue antibiotic therapy and follow up with Dr. Sampson GoonFitzgerald for further recommendations in regard to antibiotics. The patient did have sepsis on presentation; however, it resolved with above-mentioned therapy.  In regard to dysphagia which the patient was complaining of upon arrival to the hospital, the patient received EGD by Dr. Bluford Kaufmannh on 02/24/2014, which revealed LA grade A esophagitis. The patient was recommended PPIs, omeprazole 20 mg p.o. daily dose indefinitely.   In regard to acute renal failure which was noted on arrival, it was felt to be due to sepsis as well as possibly antibiotic therapy as well as IV dye, which was used for CT scanning. The patient was seen by nephrologist and recommended to follow up with them as an outpatient. The patient was noted to have also anemia. No iron studies were done in the hospital; however, the patient's anemia seemed to be stable and hemoglobin remains at 10.0 on 03/06/2014. It is recommended to follow the patient's hemoglobin level as outpatient and make decisions about checking iron studies if needed. We felt  however, the patient is iron deficient due to multiple procedures  he got done during this hospitalization as well as some blood loss related to procedures. The patient developed diarrheal stool. Clostridium difficile of stool was checked while he was in the hospital and was negative for Clostridium difficile, heavy Candida albicans however was isolated. The patient was advised to  continue diet. The patient may benefit from Lactobacillus as outpatient. The patient will be added Lactobacillus acidophilus oral capsule 1 capsule twice daily dose prior to discharge. The patient is being discharged in stable condition with above-mentioned medications and followup.   On the day of discharge, his vital signs, temperature was 98.5, pulse was 95, respiration was 20, blood pressure 141/92, saturation was 96% on room air at rest as well as on exertion.   TIME SPENT: Forty minutes.    ____________________________ Katharina Caper, MD rv:TT D: 03/07/2014 16:40:30 ET T: 03/07/2014 20:19:40 ET JOB#: [id number]  cc: Katharina Caper, MD, <Dictator> Timothy E. Thelma Barge, MD Stann Mainland. Sampson Goon, MD Laverda Sorenson, MD Les Pou. [place], [place] Sparrow [place] MD ELECTRONICALLY SIGNED 03/24/2014 17:15

## 2014-07-16 NOTE — Op Note (Signed)
PATIENT NAME:  Adrian LennertHAYES, Keval R MR#:  [id number] DATE OF BIRTH:  [birth year]  DATE OF PROCEDURE:  02/28/2014  SURGEON: Jasmine Decemberimothy E Shadeed Colberg, MD    ASSISTANT: Tiney Rougealph Ely, MD    PREOPERATIVE DIAGNOSIS: Empyema, right chest.   POSTOPERATIVE DIAGNOSIS: Empyema, right chest.   OPERATION PERFORMED:  1.  Preoperative bronchoscopy to assess endobronchial anatomy.  2.  Right thoracotomy with decortication of visceral and parietal pleura.   INDICATIONS FOR PROCEDURE: [name] is a 39 year old white male who was admitted to the hospital with a right pneumonia and an extensive right-sided pleural effusion that on further evaluation and work-up was consistent with an empyema.  He had a small pigtail catheter drain placed that was unsuccessful in draining his pleural effusion, and he was offered the above-named procedure for definitive management.  The indications and risks were explained to the patient who gave his informed consent.   DESCRIPTION OF PROCEDURE: The patient was brought to the operating suite and placed in the supine position. General endotracheal anesthesia was given with a double-lumen tube. Preoperative bronchoscopy was carried out. The right lower lobe area was slightly erythematous but there were no purulent material present on the right side. The middle lobe likewise appeared normal. The left side was grossly patent as well. The patient was then turned for a right thoracotomy. All pressure points were carefully padded. The patient was prepped and draped in the usual sterile fashion.  A posterolateral fifth interspace thoracotomy was performed by dividing the latissimus and sparing the serratus. The chest was entered. There was an extensive amount of fibrinopurulent material present within the pleural space. This was particularly present in the inferior aspect of the chest. An extensive decortication of visceral and parietal pleura was then carried out.  The diaphragm and the chest wall were completely  decorticated.  Anteriorly, we freed the lung up off the pericardium. There was no abscess that was visible within the lower lobe or the middle and upper lobes. This area was free of any significant intraparenchymal process.  Lungs were somewhat consolidated in this area but there was nothing that was fluctuant or appeared to be necrotic.  The chest was copiously irrigated. Hemostasis was complete. Three chest tubes were inserted.  Two Blake 28 French drains were placed anteriorly and posteriorly and a 19 Blake was placed along the diaphragm under the lung. All 3 were brought out through a stab wounds inferiorly. These were secured to the skin with silk sutures. The ribs were then closed with #2 Vicryl pericostal sutures.  The muscles of the chest wall were closed with #2 Vicryl, the subcutaneous tissues with 2-0 Vicryl and the skin with skin clips.  The patient tolerated the procedure well and was taken to the recovery room in stable condition.      ____________________________ Sheppard Plumberimothy E. Thelma Bargeaks, MD teo:DT D: 02/28/2014 15:53:15 ET T: 02/28/2014 16:10:20 ET JOB#: [id number]  cc: Marcial Pacasimothy E. Thelma Bargeaks, MD, <Dictator> Carmie Endalph L. Ely III, MD  Jasmine DecemberIMOTHY E Lovett Coffin MD ELECTRONICALLY SIGNED 03/01/2014 9:43

## 2014-07-16 NOTE — Consult Note (Signed)
PATIENT NAME:  Adrian Hogan, Kal R MR#:  [id number] DATE OF BIRTH:  1975-10-07  DATE OF CONSULTATION:  02/23/2014  REFERRING PHYSICIAN:  Dr. Santiago Gladavid Kasa  CONSULTING PHYSICIAN:  Sheppard Plumberimothy E. [place] Carmack, MD  REASON FOR CONSULTATION: Right lung abscess.   I have personally seen and examined Adrian Hogan. I have discussed his care with Dr. Belia HemanKasa and with Dr. Elisabeth PigeonVachhani.    HISTORY OF PRESENT ILLNESS: Adrian Hogan is a 39 year old gentleman who initially began to feel somewhat ill back in October. He states at the time [place] and the entire family came down with what was thought to be an upper respiratory tract infection characterized by cough and congestion and some low-grade fevers. The family improved significantly; however, the patient continued to feel unwell for several weeks and his cough persisted. He was seen on November 12 and was started on oral antibiotics. He failed to significantly improve and he represented back to his primary care physician where a chest x-ray was made. That revealed a right lower lobe mass and in conjunction with his fever and tachycardia, he was admitted to the hospital. The patient underwent a bronchoscopy and during a bronchoscopic examination he was noted to have free reflux of stomach contents into the back of his throat. He did not suffer any aspiration, however, at that time. The bronchoscopy was negative for any tumor, although there were some mucoid secretions in the right lower lobe, no abscess was identified.   The patient does not smoke. He states that he does have some pleuritic-type chest pain which he describes as being severe with coughing. He has had fevers at home but has not had any hemoptysis.   PAST MEDICAL HISTORY: His past medical history is essentially unremarkable except for that outlined above. He has no known drug allergies. He does not take any medications on a chronic basis.   SOCIAL HISTORY: He is married. He has several  children. 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   FAMILY HISTORY: Negative for any history of hypertension or diabetes.   REVIEW OF SYSTEMS: Positive for some weight loss and otherwise was negative except for those symptoms listed in the history of present illness.   PHYSICAL EXAMINATION: GENERAL: A pleasant, well-developed, well-nourished gentleman in no distress. He was able to speak in complete sentences. He was awake, alert, and oriented.  NECK: Supple without thyromegaly or adenopathy.  LUNGS: Equal bilaterally.  HEART: Regular.  ABDOMEN: Soft, nontender, nondistended. There were no palpable masses.  EXTREMITIES: Without clubbing, cyanosis, or edema. He had good pulses throughout. He was somewhat warm to touch and he did have a fever at the time. There were no obvious skin rashes.    ASSESSMENT AND PLAN: I have reviewed his films. I have discussed his care with Dr. Belia HemanKasa, as well as the radiologist. I believe that he most likely has an aspiration pneumonia in the right lung. There is a small pleural effusion on the right and I question whether or not this could be aspirated and sampled to see if this may represent an empyema. Otherwise, the current thought would be to allow him to receive his intravenous antibiotics and monitor his lung abscess.   I will continue to follow the patient with you. The family did suggest that they may [facility]. I encouraged them to do that if that is their desire.    Thank you very much your kind referral.  ____________________________ Sheppard Plumber Thelma Barge, MD teo:at D: 02/23/2014 14:59:42 ET T: 02/23/2014 15:46:48 ET JOB#: [id number]  cc: Marcial Pacas E. Thelma Barge, MD, <Dictator> Jasmine December MD ELECTRONICALLY SIGNED 03/01/2014 9:43

## 2014-07-18 LAB — SURGICAL PATHOLOGY

## 2014-07-20 NOTE — Consult Note (Signed)
PATIENT NAME:  Adrian Hogan, Adrian Hogan MR#:  [id number] DATE OF BIRTH:  10-Dec-1975  DATE OF CONSULTATION:  02/23/2014  REFERRING PHYSICIAN:   CONSULTING PHYSICIAN:  Stann Mainland. Sampson Goon, MD  REQUESTING PHYSICIAN: Hope Pigeon. Elisabeth Pigeon, MD  REASON FOR CONSULTATION: Lung abscess.   HISTORY OF PRESENT ILLNESS: This is a very pleasant 39 year old gentleman, really no significant past medical history who was admitted with 3 weeks of progressive cough and fevers. He was seen November 12 in urgent care and treated for influenza; however, on the 19th, he presented to his primary care doctor and had an x-ray showing right left-sided pneumonia. He was treated for this with levofloxacin for 5 days; however, symptoms persisted and he had fevers up to 102. He was also having a productive cough and has lost 10 pounds. He was admitted due to this. Repeat chest x-ray showed a lung abscess and CT confirms this. Since admission, the patient has had a BAL done by Dr. Belia Heman with finding of aspiration of gastric contents.   The patient currently reports feeling relatively worse with increasing chest pain, which he had not had much before. He is not coughing and has less fever.   The patient denies any heartburn symptoms, but has had several years of increasing dysphagia and difficulty with swallowing rice and meat. He often will vomit it up.   PAST MEDICAL HISTORY: None.   PAST SURGICAL HISTORY: None.   FAMILY HISTORY: Noncontributory.   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 No alcohol or drugs. He is married with children.   ALLERGIES: No known drug allergies.  REVIEW OF SYSTEMS: Eleven systems reviewed and negative except as per HPI.  ANTIBIOTICS SINCE ADMISSION: Include Zosyn and vancomycin. Apparently, he had a red man's reaction to vancomycin.   PHYSICAL EXAMINATION: VITAL SIGNS: Temperature 98.2, T-max since admission is 101.7 on 11/30, pulse 100, blood pressure 111/60, respirations 20, sat  96% on room air.  GENERAL: He is thin. He is in some pain and is sleepy after pain meds.  HEENT: Pupils equal, round, reactive to light and accommodation. Extraocular movements are intact. Sclerae are anicteric. Oropharynx clear.  NECK: Supple.  HEART: Regular.  LUNGS: Clear on the left. On the right, he has crackles and decreased breath sounds.  ABDOMEN: Soft, nontender, nondistended. No hepatosplenomegaly.  EXTREMITIES: No clubbing, cyanosis, or edema.  NEUROLOGIC: He is alert and oriented x3. Grossly nonfocal neuro exam.   DIAGNOSTIC DATA: White count on admission 15.5, currently 12.4, hemoglobin 11.6, platelets 434,000, down from 519,000. Blood culture negative. HIV nonreactive. Renal function is normal.  IMAGING: Chest x-ray done November 19 showed right lower lobe opacity most consistent with pneumonia with a small effusion. Followup chest x-ray November 30 showed right lower lobe infiltrate with right pleural effusion and a prominent cavitary lesion within the right lower lobe. CT November 30 showed large complex abscess in the anterior aspect of the right lower lobe measuring 8.5 x 7.5 x 5.6 cm with prominent air fluid level and surrounding thick soft tissue density. There is mild associated airspace consolidation and an enlarged esophageal node.   IMPRESSION: A 39 year old gentleman admitted with a large right lower lobe lung abscess with air fluid level, white count of 15,000, and a history of dysphagia. Bronchoalveolar lavage seems to show aspiration of gastric contents. Cultures are pending.   RECOMMENDATIONS: 1.  Continue vancomycin and Zosyn. Further recommendations will be based on BAL results.  2.  I agree with GI consult and evaluation. We  will need work-up of this.  3.  Agree with CT surgery consult.  4.  He will likely need prolonged antibiotic course, but most of this will be able to be given orally. Discussed with the family that he will likely take a long time to heal from  this degree of abscess. 5.  Given that he is having some new chest pain, we will check a chest x-ray.  Thank you for the consult. I will be glad to follow with you.    ____________________________ Stann Mainlandavid P. Sampson GoonFitzgerald, MD dpf:sw D: 02/23/2014 14:11:24 ET T: 02/23/2014 14:34:36 ET JOB#: [id number]  cc: Stann Mainlandavid P. Sampson GoonFitzgerald, MD, <Dictator> DAVID Sampson GoonFITZGERALD MD ELECTRONICALLY SIGNED 03/27/2014 21:22

## 2015-05-18 ENCOUNTER — Ambulatory Visit: Payer: Self-pay | Admitting: Family Medicine

## 2015-05-18 DIAGNOSIS — J309 Allergic rhinitis, unspecified: Secondary | ICD-10-CM | POA: Insufficient documentation

## 2015-05-18 DIAGNOSIS — Z8349 Family history of other endocrine, nutritional and metabolic diseases: Secondary | ICD-10-CM | POA: Insufficient documentation

## 2015-05-18 DIAGNOSIS — G629 Polyneuropathy, unspecified: Secondary | ICD-10-CM | POA: Insufficient documentation

## 2015-05-18 DIAGNOSIS — J869 Pyothorax without fistula: Secondary | ICD-10-CM | POA: Insufficient documentation

## 2015-05-19 ENCOUNTER — Encounter: Payer: Self-pay | Admitting: Family Medicine

## 2015-05-19 ENCOUNTER — Ambulatory Visit (INDEPENDENT_AMBULATORY_CARE_PROVIDER_SITE_OTHER): Payer: BC Managed Care – PPO | Admitting: Family Medicine

## 2015-05-19 VITALS — BP 118/74 | HR 89 | Temp 98.5°F | Resp 16 | Ht 72.0 in | Wt 244.0 lb

## 2015-05-19 DIAGNOSIS — R5383 Other fatigue: Secondary | ICD-10-CM

## 2015-05-19 NOTE — Progress Notes (Signed)
       Patient: Adrian Hogan Male    DOB: 02-24-1976   40 y.o.   MRN: [id number] Visit Date: 05/19/2015  Today's Provider: [name], MD   Chief Complaint  Patient presents with  . Fatigue   Subjective:    HPI  Fatigue for the last few months. Is a light sleeper, but no change in sleep habits from his usual. Does not feel sleep during the day. 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 Work is going well. His wife telss him he has been a bit more irritable, but not depressed or anxious. No change in mood. Started taking non-sedating antihistamine last week, but no other medications. No dietary changes.  Has had frequent headaches over the last few months.  Has no family history of heart of lung disease, but does have family history thyroid disorders.    No Known Allergies Previous Medications   No medications on file    Review of Systems  Constitutional: Positive for fatigue. Negative for fever, chills and appetite change.  Respiratory: Negative for chest tightness, shortness of breath and wheezing.   Cardiovascular: Negative for chest pain and palpitations.  Gastrointestinal: Negative for nausea, vomiting and abdominal pain.  Neurological: Positive for headaches.    Social History  Substance Use Topics  . Smoking status: Never Smoker   . Smokeless tobacco: Never Used  . Alcohol Use: No   Objective:   BP 118/74 mmHg  Pulse 89  Temp(Src) 98.5 F (36.9 C) (Oral)  Resp 16  Ht 6' (1.829 m)  Wt 244 lb (110.678 kg)  BMI 33.09 kg/m2  SpO2 98%  Physical Exam   General Appearance:    Alert, cooperative, no distress  Eyes:    PERRL, conjunctiva/corneas clear, EOM's intact       Lungs:     Clear to auscultation bilaterally, respirations unlabored  Heart:    Regular rate and rhythm  Neurologic:   Awake, alert, oriented x 3. No apparent focal neurological           defect.      Epworth=9      Assessment & Plan:     1. Other fatigue  - CBC - Comprehensive metabolic panel -  T4 AND TSH - Testosterone,Free and Total  Consider overnight oximetry.        [name], MD  [facility]

## 2015-05-20 LAB — COMPREHENSIVE METABOLIC PANEL
ALBUMIN: 4.6 g/dL (ref 3.5–5.5)
ALT: 22 IU/L (ref 0–44)
AST: 18 IU/L (ref 0–40)
Albumin/Globulin Ratio: 1.9 (ref 1.1–2.5)
Alkaline Phosphatase: 125 IU/L — ABNORMAL HIGH (ref 39–117)
BUN / CREAT RATIO: 14 (ref 8–19)
BUN: 17 mg/dL (ref 6–20)
Bilirubin Total: 0.4 mg/dL (ref 0.0–1.2)
CO2: 25 mmol/L (ref 18–29)
CREATININE: 1.2 mg/dL (ref 0.76–1.27)
Calcium: 9.9 mg/dL (ref 8.7–10.2)
Chloride: 99 mmol/L (ref 96–106)
GFR calc non Af Amer: 76 mL/min/{1.73_m2} (ref >59)
GFR, EST AFRICAN AMERICAN: 88 mL/min/{1.73_m2} (ref >59)
GLUCOSE: 92 mg/dL (ref 65–99)
Globulin, Total: 2.4 g/dL (ref 1.5–4.5)
Potassium: 4.5 mmol/L (ref 3.5–5.2)
Sodium: 141 mmol/L (ref 134–144)
TOTAL PROTEIN: 7 g/dL (ref 6.0–8.5)

## 2015-05-20 LAB — TESTOSTERONE,FREE AND TOTAL
TESTOSTERONE FREE: 5.6 pg/mL — AB (ref 8.7–25.1)
Testosterone: 233 ng/dL — ABNORMAL LOW (ref 348–1197)

## 2015-05-20 LAB — CBC
HEMOGLOBIN: 14.1 g/dL (ref 12.6–17.7)
Hematocrit: 41.8 % (ref 37.5–51.0)
MCH: 28.8 pg (ref 26.6–33.0)
MCHC: 33.7 g/dL (ref 31.5–35.7)
MCV: 86 fL (ref 79–97)
Platelets: 207 10*3/uL (ref 150–379)
RBC: 4.89 x10E6/uL (ref 4.14–5.80)
RDW: 14 % (ref 12.3–15.4)
WBC: 6.2 10*3/uL (ref 3.4–10.8)

## 2015-05-20 LAB — T4 AND TSH
T4, Total: 8.3 ug/dL (ref 4.5–12.0)
TSH: 1.86 u[IU]/mL (ref 0.450–4.500)

## 2015-05-22 ENCOUNTER — Telehealth: Payer: Self-pay

## 2015-05-22 DIAGNOSIS — R7989 Other specified abnormal findings of blood chemistry: Secondary | ICD-10-CM

## 2015-05-22 NOTE — Telephone Encounter (Signed)
Advised patient of results. Lab slip printed and placed up front for pick up.

## 2015-05-22 NOTE — Telephone Encounter (Signed)
-----   Message from Malva Limes, MD sent at 05/21/2015  9:07 PM EST ----- Thyroid functions are normal, but testosterone levels are low, which could be causing fatigue. Need to recheck Free and total testosterone, and prolactin levels to see if he needs testosterone supplment.

## 2015-05-24 LAB — PROLACTIN: Prolactin: 10 ng/mL (ref 4.0–15.2)

## 2015-06-02 ENCOUNTER — Telehealth: Payer: Self-pay

## 2015-06-02 MED ORDER — TESTOSTERONE 2 MG/24HR TD PT24: 2.0000 mg | MEDICATED_PATCH | Freq: Every day | TRANSDERMAL | Status: DC

## 2015-06-02 NOTE — Telephone Encounter (Signed)
Patient was notified. Patient wanted to know if he could try something else besides the androgel? Patient stated it not he will try that. Patient also said that he is willing to come back by the office and do another blood sample.

## 2015-06-02 NOTE — Telephone Encounter (Signed)
Please review. Thank you-aa 

## 2015-06-02 NOTE — Telephone Encounter (Signed)
Patient called to know lab results that were drawn last week. Please advise. Thanks!

## 2015-06-02 NOTE — Telephone Encounter (Signed)
Please advise 

## 2015-06-02 NOTE — Telephone Encounter (Signed)
Rx called in to pharmacy. 

## 2015-06-02 NOTE — Telephone Encounter (Signed)
Prolactin is normal. Testosterone level was not done due to lab order error.  We can try prescription for Androgel 1.62% 2 actuations daily, ,75g 1 refill. Insurance might require we have blood drawn again, but can go ahead and call in prescription to see if they cover it.

## 2015-06-02 NOTE — Telephone Encounter (Signed)
Can try androderm 2% patch, 1 patch daily, #30 rf x 1

## 2015-06-09 ENCOUNTER — Telehealth: Payer: Self-pay | Admitting: Family Medicine

## 2015-06-09 DIAGNOSIS — R7989 Other specified abnormal findings of blood chemistry: Secondary | ICD-10-CM

## 2015-06-09 NOTE — Telephone Encounter (Signed)
Pt is requesting a call back about medication requiring an auth.  CB#[phone]/MW

## 2015-06-09 NOTE — Telephone Encounter (Signed)
Returned pt's call.

## 2015-06-17 LAB — TESTOSTERONE,FREE AND TOTAL
Testosterone, Free: 12.8 pg/mL (ref 8.7–25.1)
Testosterone: 165 ng/dL — ABNORMAL LOW (ref 348–1197)

## 2015-06-18 ENCOUNTER — Other Ambulatory Visit: Payer: Self-pay | Admitting: Family Medicine

## 2015-06-18 MED ORDER — TESTOSTERONE 2 MG/24HR TD PT24: 2.0000 mg | MEDICATED_PATCH | Freq: Every day | TRANSDERMAL | Status: DC

## 2015-06-19 ENCOUNTER — Telehealth: Payer: Self-pay

## 2015-06-19 NOTE — Telephone Encounter (Signed)
-----   Message from Malva Limesonald E Fisher, MD sent at 06/18/2015  5:04 PM EDT ----- Repeat cholesterol is still very low. Please call in rx for Androderm patch and schedule follow up 4 weeks after starting testosterone.

## 2015-06-19 NOTE — Telephone Encounter (Signed)
Advised that repeat testosterone was still low. Called in Androderm as below. Patient will call back and schedule 4 week appt.

## 2015-06-28 ENCOUNTER — Telehealth: Payer: Self-pay | Admitting: Family Medicine

## 2015-06-28 NOTE — Telephone Encounter (Signed)
Per pharmacist at [facility] Androderm patches only come in 60 count packages. Per Dr. Sherrie MustacheFisher it is okay to change RX to 60 patches. RX will last patient for 2 months instead of 1 month.

## 2015-06-28 NOTE — Telephone Encounter (Signed)
Will with [facility] called stating the Rx for Testosterone (ANDRODERM) 2 MG/24HR PT24 will need to be changed to 60 patches.  CB#[phone]/MW

## 2015-06-30 ENCOUNTER — Other Ambulatory Visit: Payer: Self-pay | Admitting: Family Medicine

## 2015-06-30 NOTE — Telephone Encounter (Signed)
Pt stated that he went to pick up his Testosterone (ANDRODERM) 2 MG/24HR PT24 and his co-pay was 60$. Pt wanted to know if there was a cheaper medication that would do the same thing. If so, [facility] Pt stated he understands if this is the cheapest but he wanted to check before he paid 60$. Please advise. Thanks TNP

## 2015-06-30 NOTE — Telephone Encounter (Signed)
Please review. Thanks!  

## 2015-07-03 NOTE — Telephone Encounter (Signed)
Patient stated that he went ahead and picked-up the rx. Patient stated that he will call his insurance before its time for the next refill to check if there is a cheaper med.

## 2015-07-03 NOTE — Telephone Encounter (Signed)
He needs to check his insurance formulary to see if there is alternative with a lower copay.

## 2016-07-05 IMAGING — CR DG CHEST 1V PORT
1 series · 1 of 1 positions shown · non-contrast
Comparison: 03/01/2014

CLINICAL DATA: Post thoracotomy and placement of right chest tubes.

EXAM:
PORTABLE CHEST - 1 VIEW

[ap]
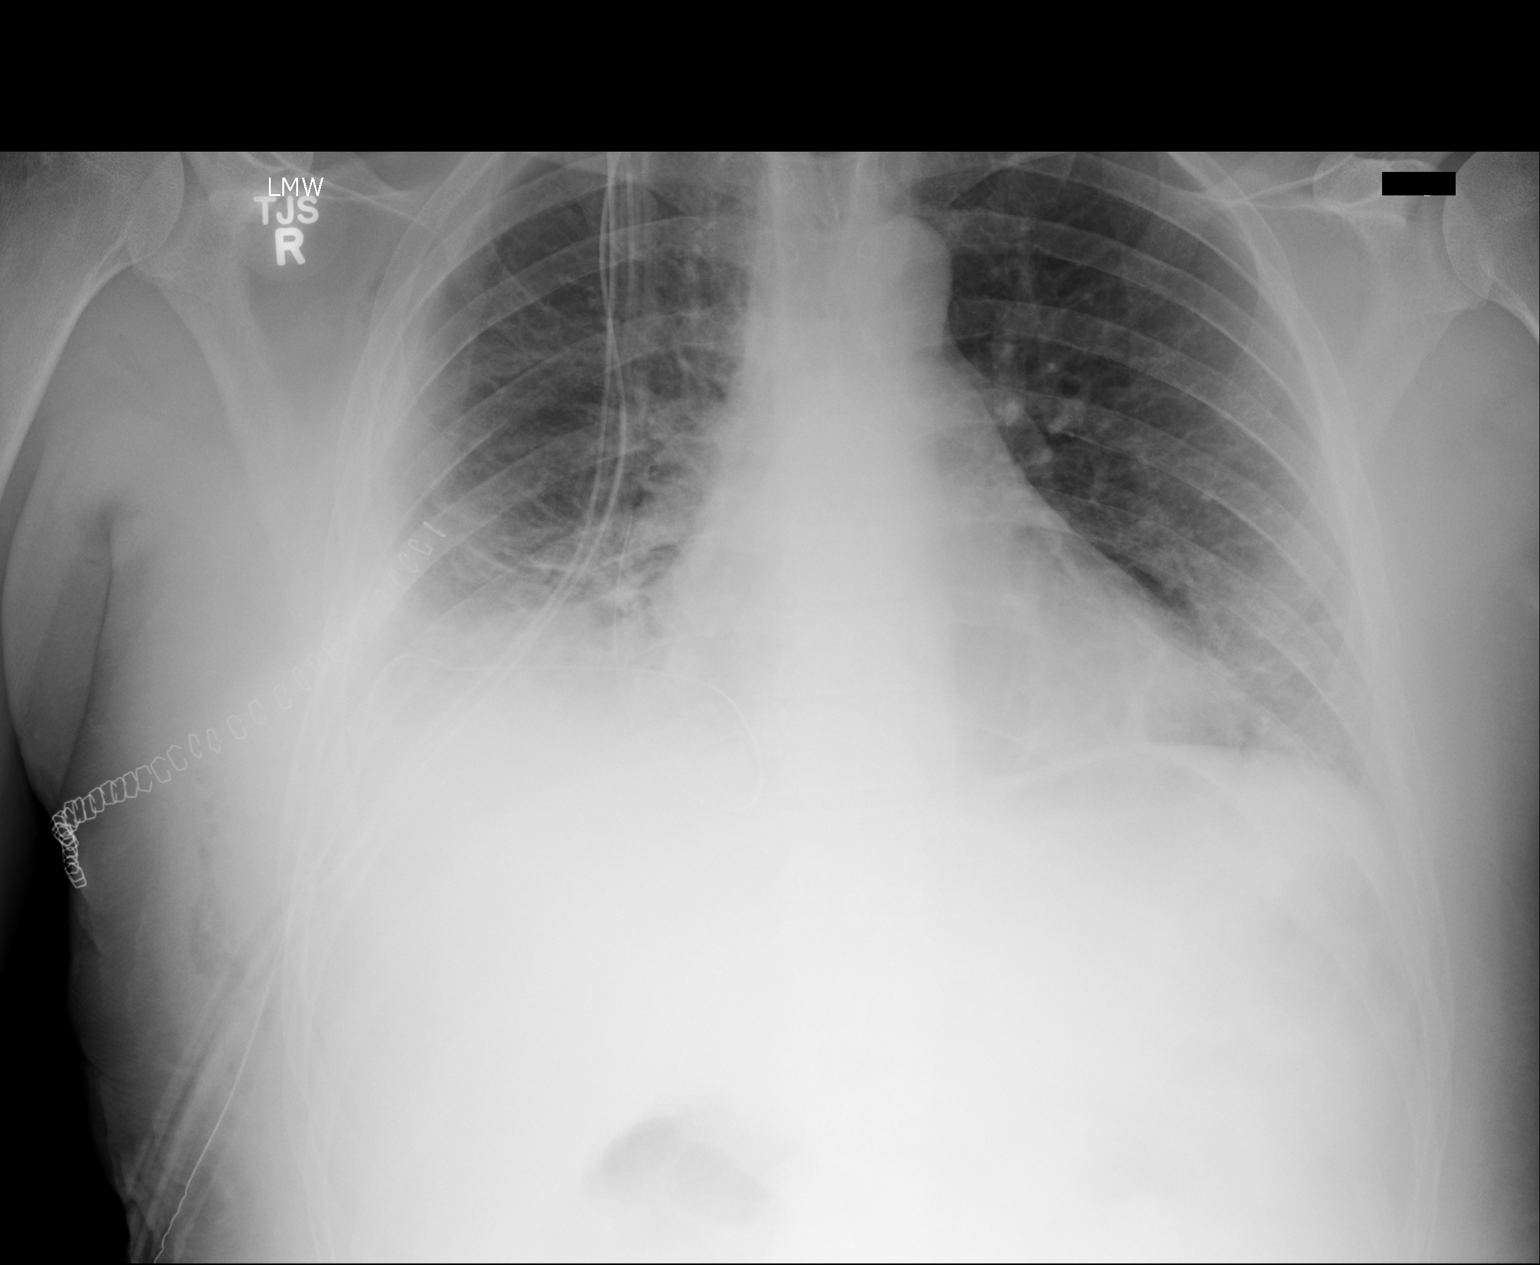

[1 of 1 positions shown; findings below may reference images not displayed]

FINDINGS: Again noted are 3 right chest tubes. There is no significant
pneumothorax. Subcutaneous gas in the right chest. Persistent
densities at the right lung base most likely related to volume loss.
Few patchy densities at the left lung base are suggestive for mild
atelectasis. Heart size is normal. The trachea is midline.
IMPRESSION: Stable position of the right chest tubes without a pneumothorax.

Basilar densities, right side greater the left. Findings most likely
related to volume loss.

## 2016-07-06 IMAGING — CR DG CHEST 1V PORT
1 series · 1 of 1 positions shown · non-contrast
Comparison: 03/02/2014

CLINICAL DATA: Pleural effusion.  Chest tube

EXAM:
PORTABLE CHEST - 1 VIEW

[ap]
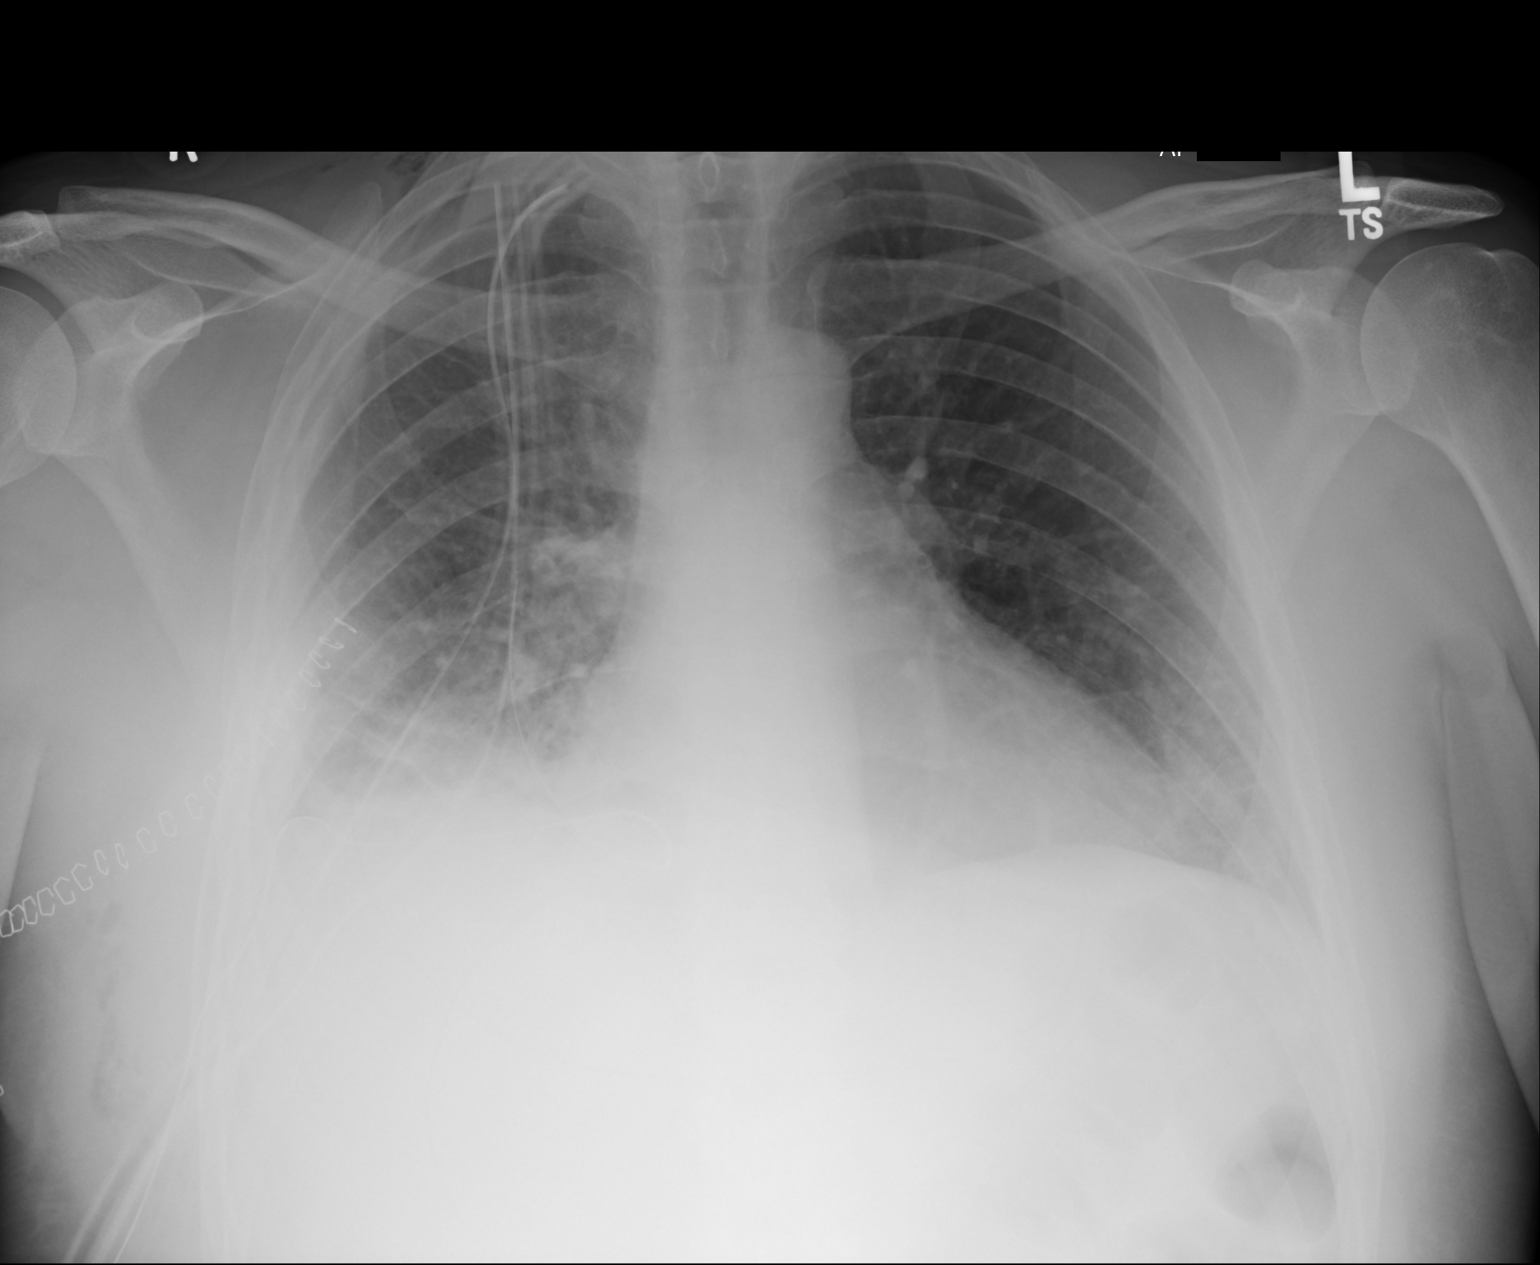

[1 of 1 positions shown; findings below may reference images not displayed]

FINDINGS: Multiple right chest tubes unchanged in position. Negative for
pneumothorax. Subcutaneous emphysema noted in the right neck. There
is a small right effusion. Right lower lobe atelectasis unchanged

Left lung remains clear.  Negative for edema.
IMPRESSION: Multiple right chest tubes in place without pneumothorax. Right
lower lobe atelectasis and effusion unchanged.

## 2016-07-06 IMAGING — CR DG CHEST 1V PORT
1 series · 1 of 1 positions shown · non-contrast
Comparison: 03/03/2014.

CLINICAL DATA: Pneumothorax.

EXAM:
PORTABLE CHEST - 1 VIEW

[ap]
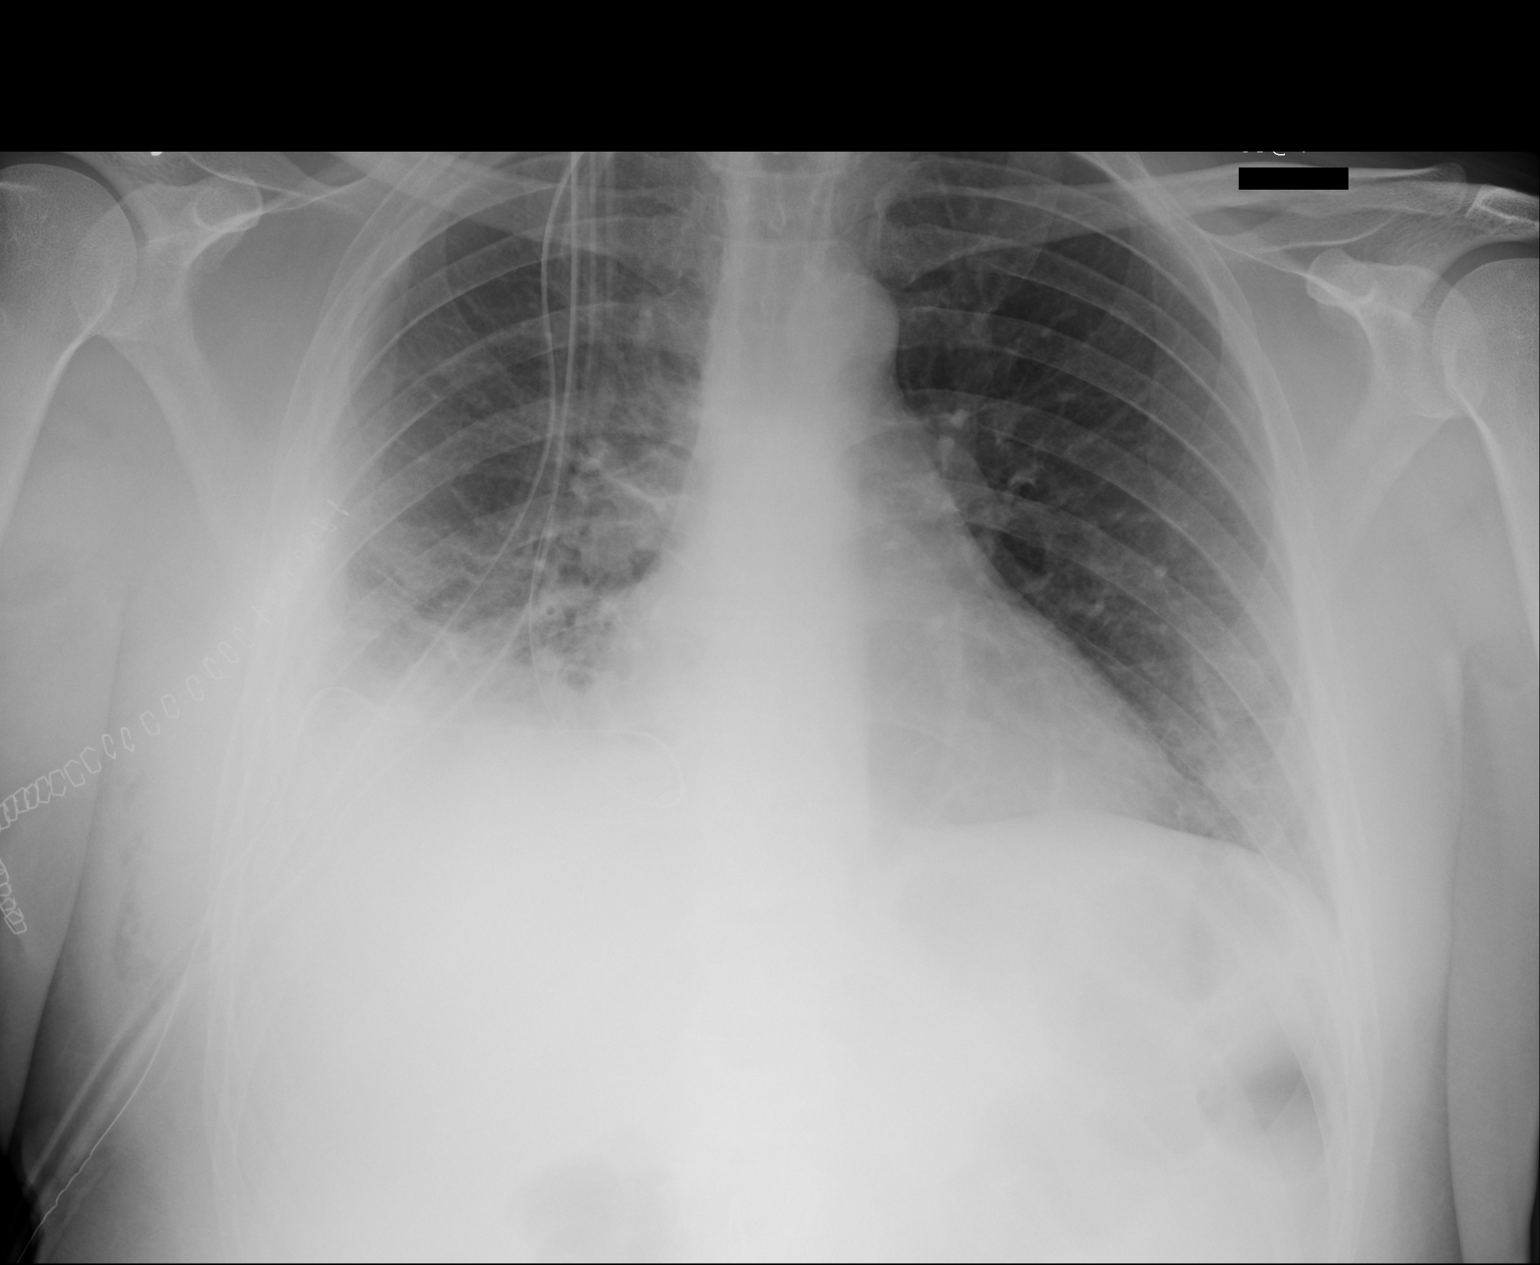

[1 of 1 positions shown; findings below may reference images not displayed]

FINDINGS: Support apparatus: Dual RIGHT thoracostomy tubes remain present with
the tips at the RIGHT apex. Position of the tubes is unchanged.
Surgical staples are present along the RIGHT chest compatible with
thoracotomy. There appears to be a pleural drainage catheter that
extends across the RIGHT base before terminating near the level of
the RIGHT hilum.

Cardiomediastinal Silhouette:  Unchanged.

Lungs: RIGHT basilar collapse/ consolidation.  No pneumothorax.

Effusions: No LEFT pleural effusion. RIGHT pleural effusion
difficult to assess based on collapse/ consolidation of the RIGHT
base.

Other:  None.
IMPRESSION: 1. Dual RIGHT thoracostomy tubes with the tips at the RIGHT apex,
unchanged.
2. RIGHT basilar pleural drainage catheter.
3. Unchanged collapse/consolidation of the RIGHT base.

## 2016-07-10 IMAGING — CR DG CHEST 2V
1 series · 2 of 2 positions shown · non-contrast
Comparison: 03/03/2014

CLINICAL DATA: Assess for pleural effusion, status post surgery to
clean out right lung abscess

EXAM:
CHEST  2 VIEW

[Series 1: dxr chest pa (or ap) and lateral · 0.14mm/px · 2 of 2 slices shown]
[im 1/2]
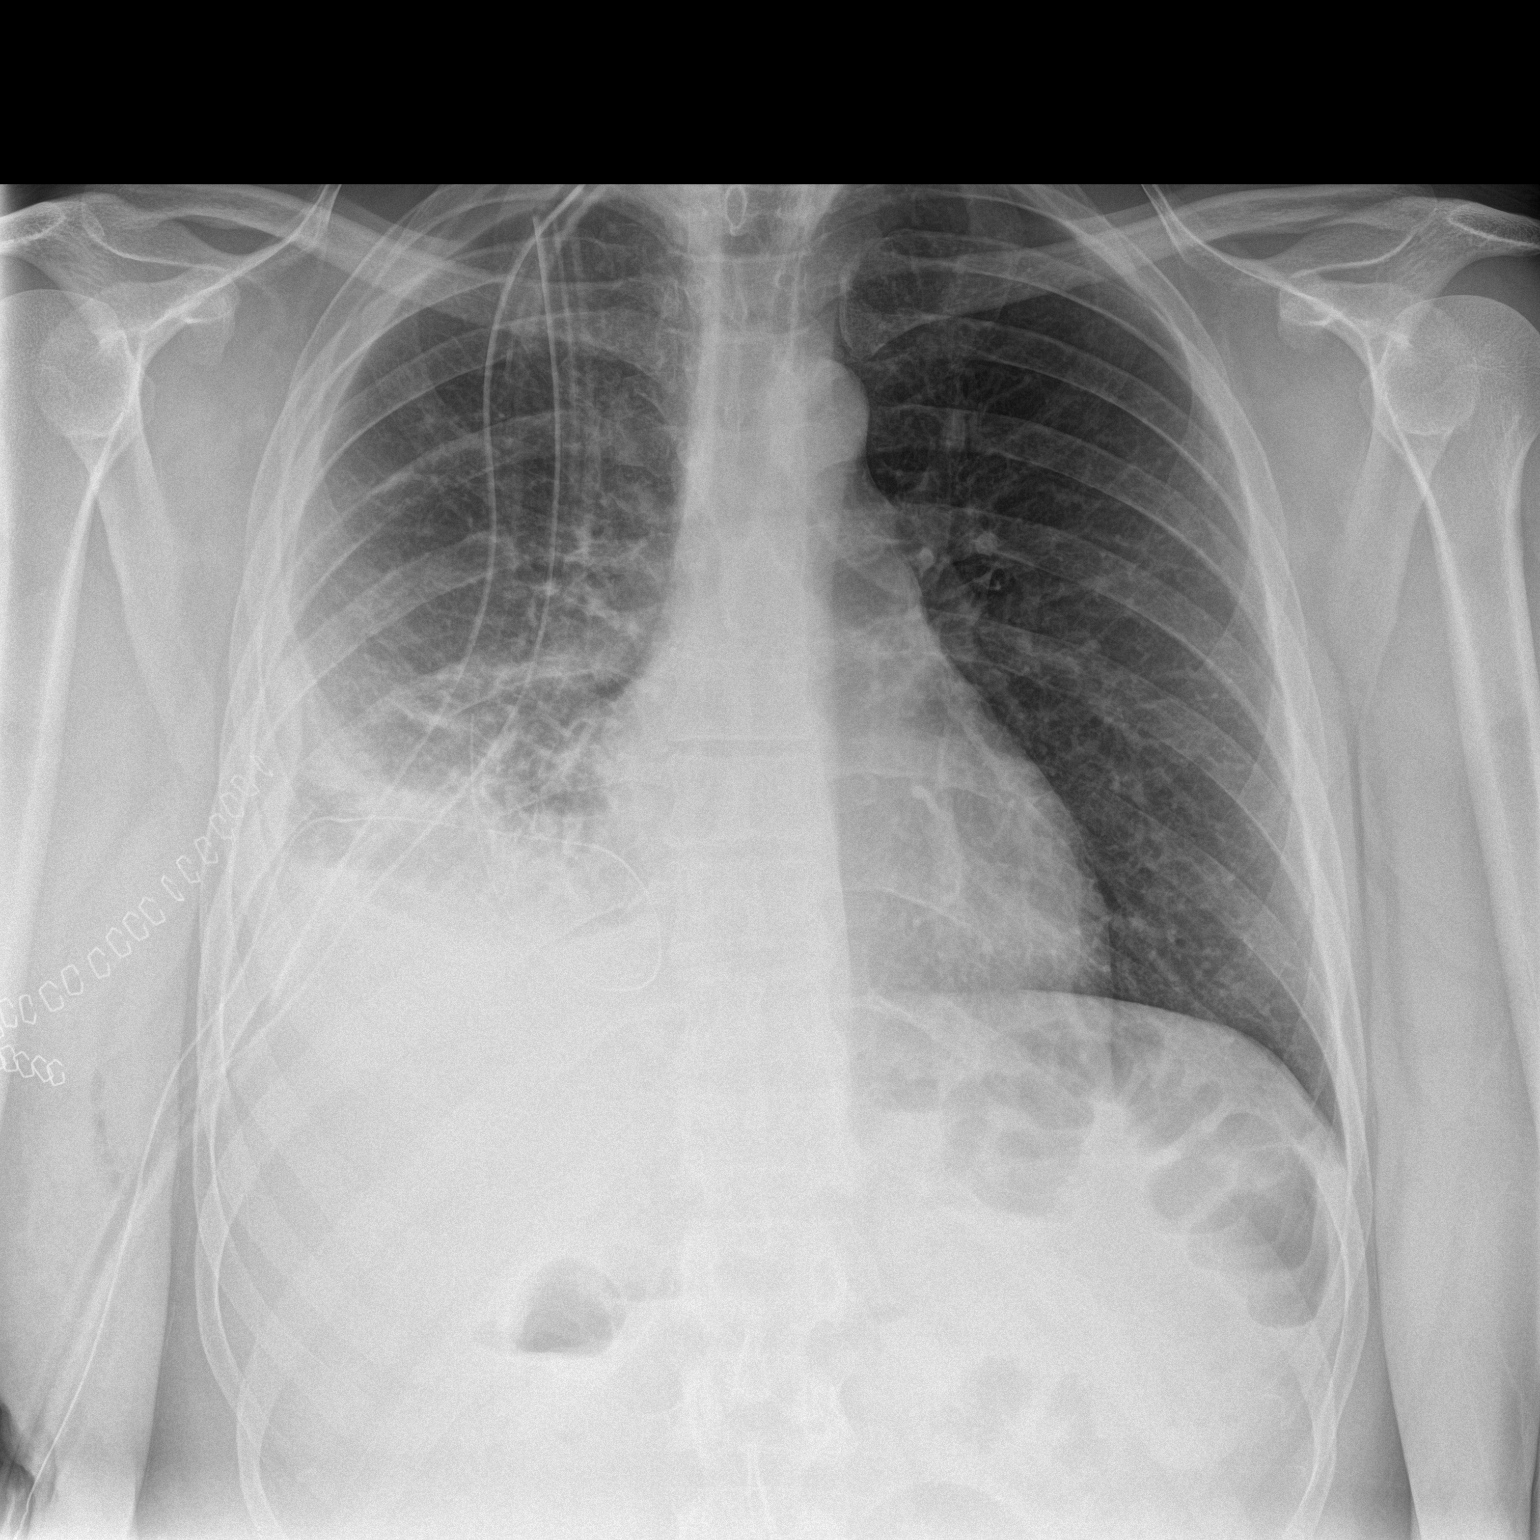
[im 2/2]
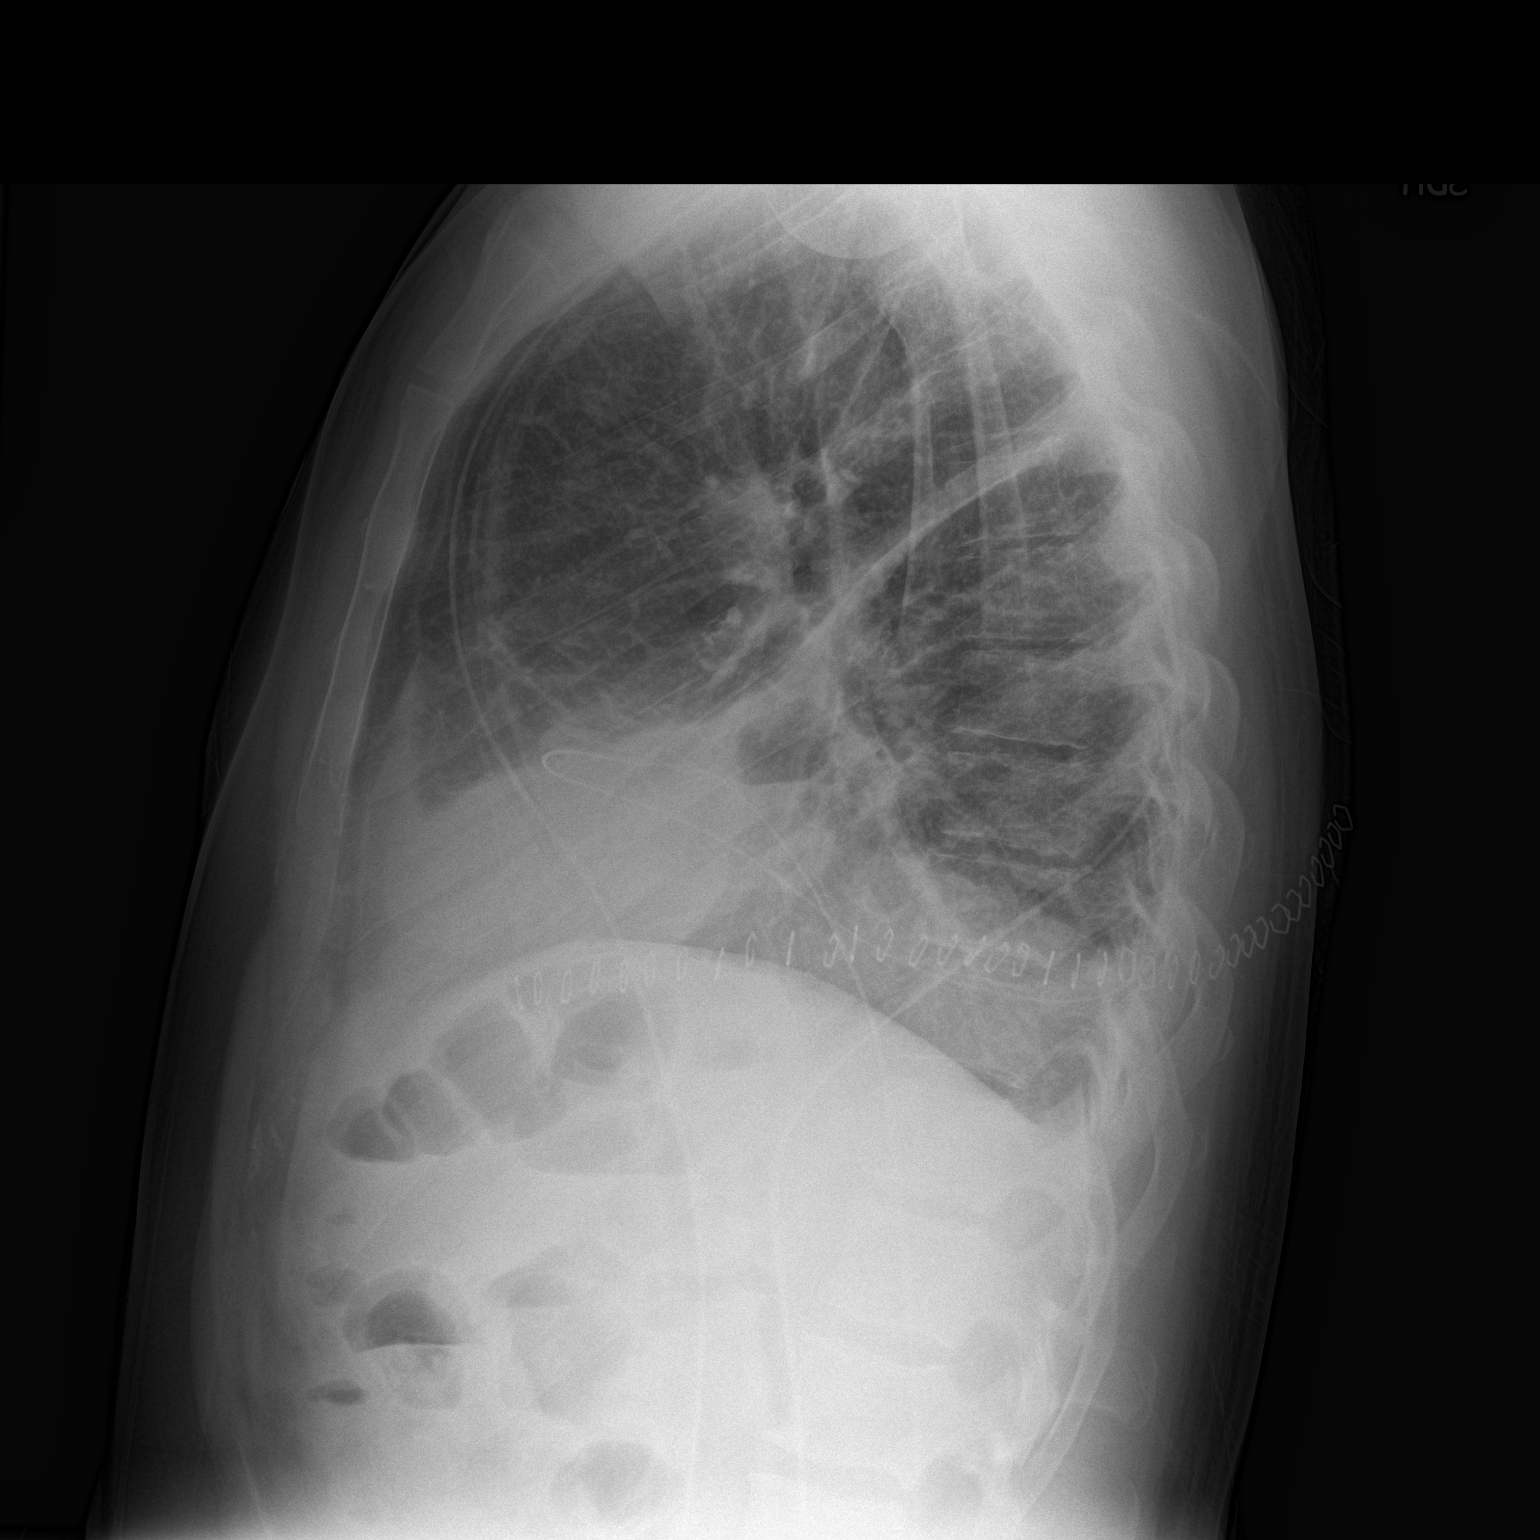

[2 of 2 positions shown; findings below may reference images not displayed]

FINDINGS: Two right apical chest tubes and one right basilar pleural drain,
unchanged.

Small right pleural effusion. Associated right lower lobe opacity,
at least some of which likely reflects atelectasis, unchanged. No
pneumothorax.

Left lung is clear.

Heart is normal in size.

Skin staples overlying the right lateral hemithorax. Minimal
subcutaneous emphysema.
IMPRESSION: Small right pleural effusion. Associated right lower lobe opacity,
unchanged. No pneumothorax.

Stable right apical chest tubes and right basilar pleural drain.

## 2016-07-13 IMAGING — CR DG CHEST 2V
1 series · 2 of 2 positions shown · non-contrast
Comparison: 03/07/2014

CLINICAL DATA: Followup right chest tubes

EXAM:
CHEST  2 VIEW

[Series 1: dxr chest pa (or ap) and lateral · 0.14mm/px · 2 of 2 slices shown]
[im 1/2]
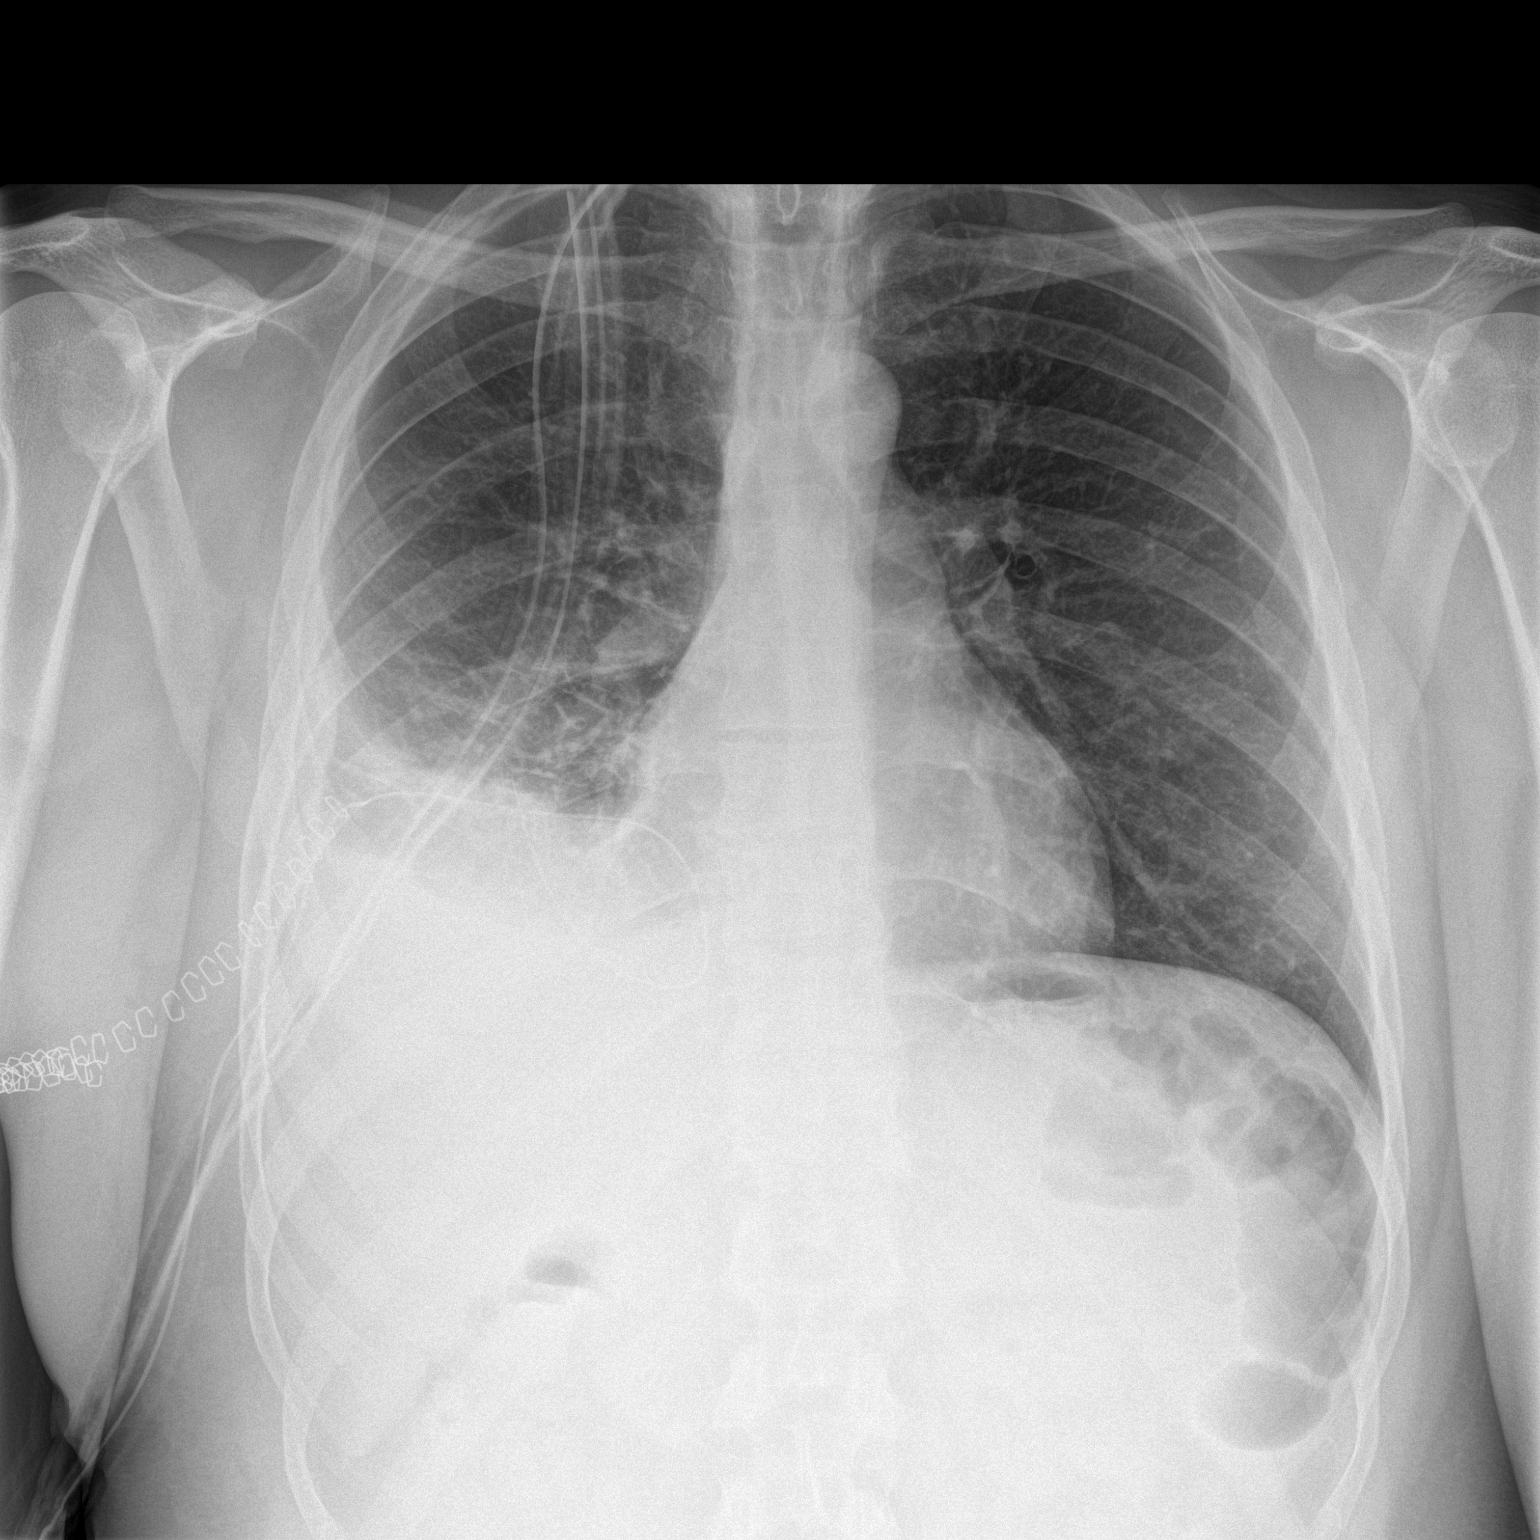
[im 2/2]
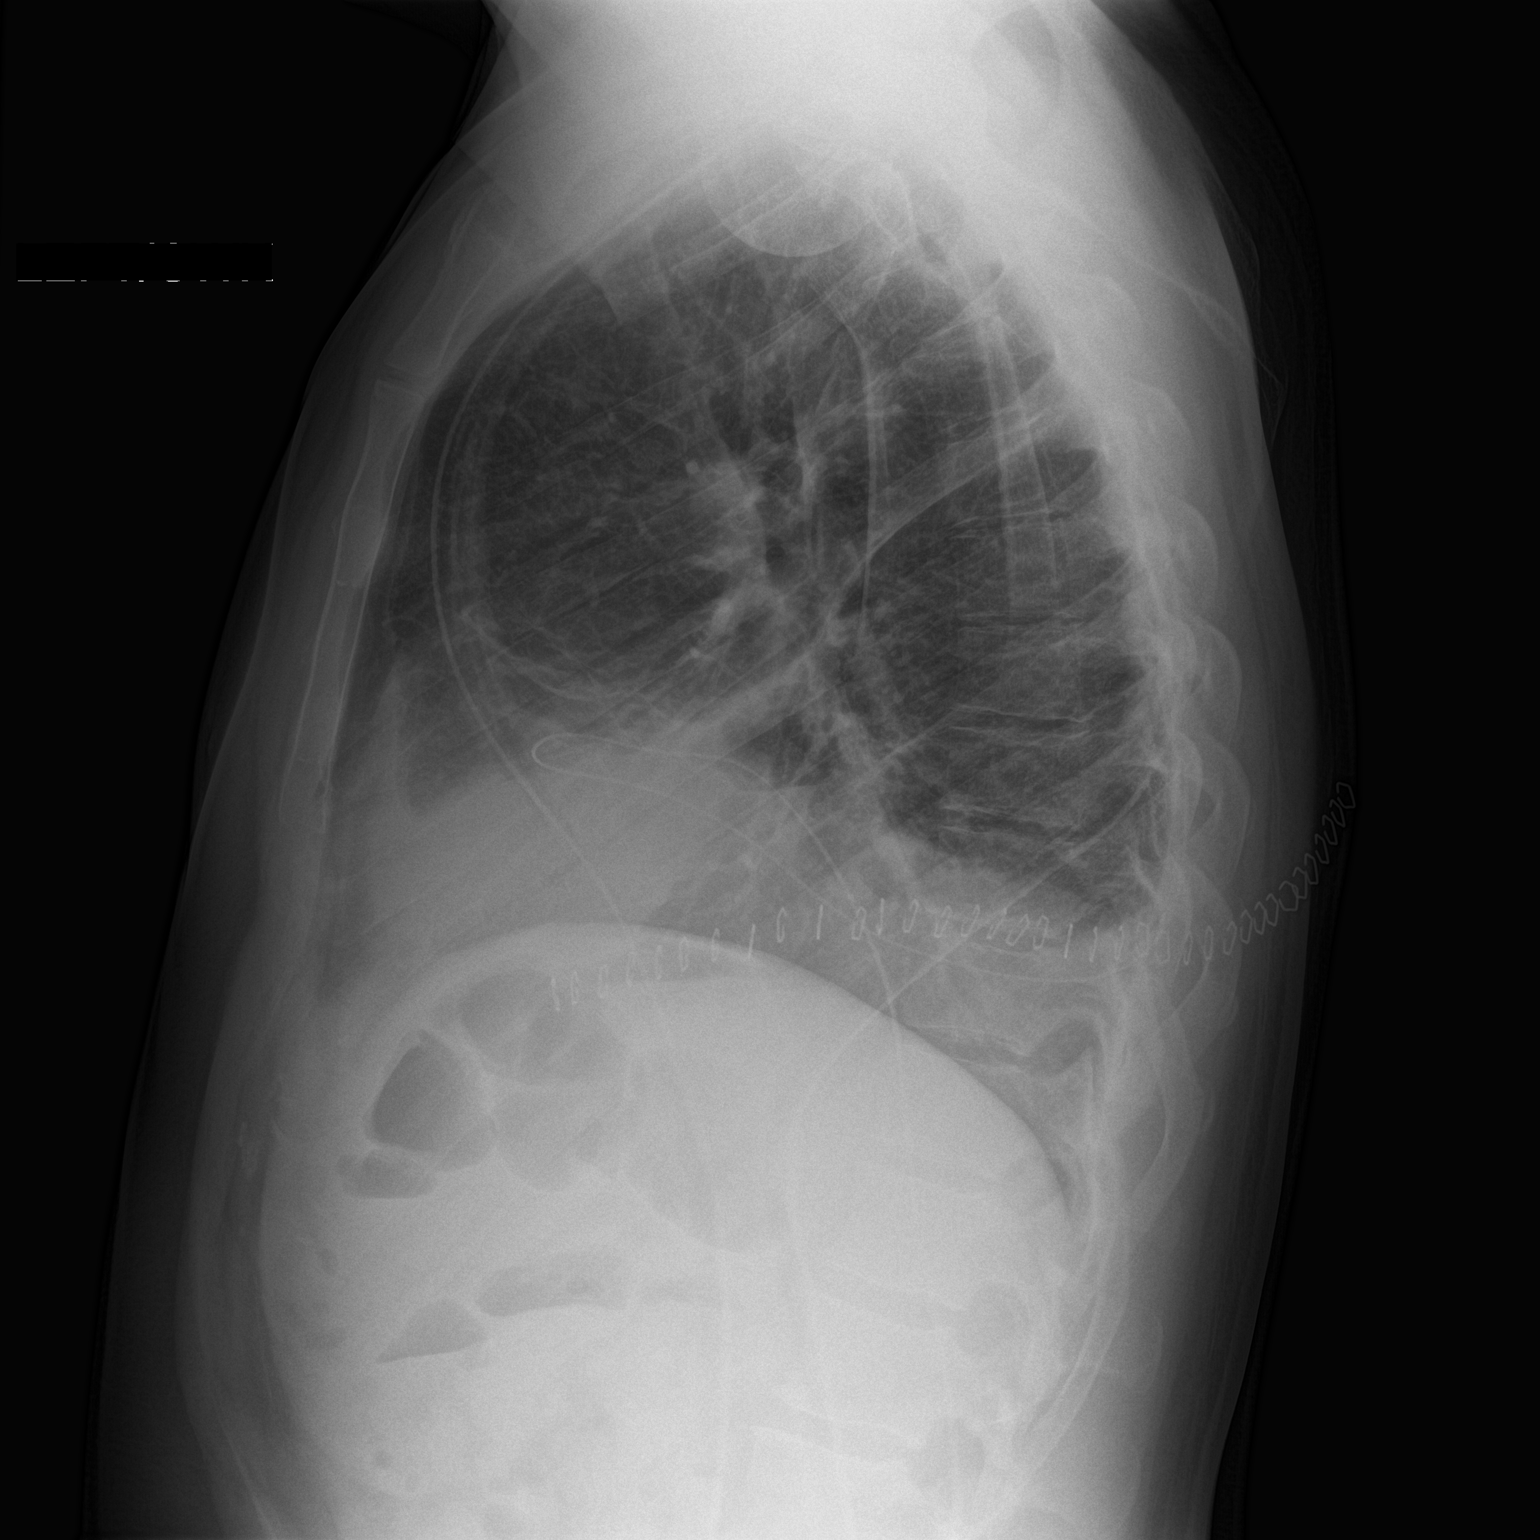

[2 of 2 positions shown; findings below may reference images not displayed]

FINDINGS: Cardiomediastinal silhouette is stable. Two right upper chest tubes
are unchanged in position. A lower pleural catheter is unchanged in
position. Again noted skin staples right lower chest wall.
Persistent small right pleural effusion with right basilar
atelectasis or infiltrate. Left lung is clear. No pneumothorax.
IMPRESSION: Stable right chest tubes. Persistent small right pleural effusion
with right basilar atelectasis on infiltrate. No pneumothorax. Left
lung is clear.

## 2018-04-06 ENCOUNTER — Encounter (INDEPENDENT_AMBULATORY_CARE_PROVIDER_SITE_OTHER): Payer: Self-pay

## 2018-04-06 ENCOUNTER — Encounter: Payer: Self-pay | Admitting: Gastroenterology

## 2018-04-06 ENCOUNTER — Ambulatory Visit: Payer: BC Managed Care – PPO | Admitting: Gastroenterology

## 2018-04-06 ENCOUNTER — Other Ambulatory Visit: Payer: Self-pay

## 2018-04-06 VITALS — BP 130/76 | HR 81 | Ht 72.0 in | Wt 253.6 lb

## 2018-04-06 DIAGNOSIS — R4702 Dysphasia: Secondary | ICD-10-CM

## 2018-04-06 NOTE — Progress Notes (Signed)
Gastroenterology Consultation  Referring Provider:     Linus Salmons, MD Primary Care Physician:  Adrian Limes, MD Primary Gastroenterologist:  Adrian Hogan     Reason for Consultation:     Dysphagia        HPI:   Adrian Hogan is a 43 y.o. y/o male referred for consultation & management of Dysphagia by Adrian Hogan, Adrian Isaacs, MD.  This patient comes in today after being sent to me by Adrian Hogan for dysphagia.  The patient has a history of dysphagia going on for approximately 2 years.  Patient appears to have had an upper endoscopy back in 2015 and it was reported that he had dilation at that time.  The patient also reports that he had a chest tube placed in 2015 and subsequently removed.  He has a history of aspiration pneumonia.  At the time the patient was seen by ENT it was recommended that he follow up with me for his dysphagia. He has a history of sleep apnea. The patient denies any unexplained weight loss black stools or bloody stools.  He states that he only has these episodes of dysphasia approximately once a month but usually has to excuse himself and vomit the food bolus back up.  He does not recall if they took biopsies at his last EGD in 2015 but he does state that they dilated his esophagus although they did not see a stricture as reported by him.  The patient denies any heartburn.  Past Medical History:  Diagnosis Date  . Pneumonia 2015   Developed empyema requiried prolonged chest tube    Past Surgical History:  Procedure Laterality Date  . VASECTOMY  2016    Prior to Admission medications   Medication Sig Start Date End Date Taking? Authorizing Provider  Testosterone (ANDRODERM) 2 MG/24HR PT24 Place 2 mg onto the skin daily. 06/18/15   Adrian Limes, MD    Family History  Problem Relation Age of Onset  . Graves' disease Brother   . Arthritis Maternal Grandmother      Social History   Tobacco Use  . Smoking status: Never Smoker  . Smokeless tobacco: Never  Used  Substance Use Topics  . Alcohol use: No  . Drug use: No    Allergies as of 04/06/2018  . (No Known Allergies)    Review of Systems:    All systems reviewed and negative except where noted in HPI.   Physical Exam:  There were no vitals taken for this visit. No LMP for male patient. General:   Alert,  Well-developed, well-nourished, pleasant and cooperative in NAD Head:  Normocephalic and atraumatic. Eyes:  Sclera clear, no icterus.   Conjunctiva pink. Ears:  Normal auditory acuity. Nose:  No deformity, discharge, or lesions. Mouth:  No deformity or lesions,oropharynx pink & moist. Neck:  Supple; no masses or thyromegaly. Lungs:  Respirations even and unlabored.  Clear throughout to auscultation.   No wheezes, crackles, or rhonchi. No acute distress. Heart:  Regular rate and rhythm; no murmurs, clicks, rubs, or gallops. Abdomen:  Normal bowel sounds.  No bruits.  Soft, non-tender and non-distended without masses, hepatosplenomegaly or hernias noted.  No guarding or rebound tenderness.  Negative Carnett sign.   Rectal:  Deferred.  Msk:  Symmetrical without gross deformities.  Good, equal movement & strength bilaterally. Pulses:  Normal pulses noted. Extremities:  No clubbing or edema.  No cyanosis. Neurologic:  Alert and oriented x3;  grossly normal neurologically.  Skin:  Intact without significant lesions or rashes.  No jaundice. Lymph Nodes:  No significant cervical adenopathy. Psych:  Alert and cooperative. Normal mood and affect.  Imaging Studies: No results found.  Assessment and Plan:   Adrian Hogan is a 43 y.o. y/o male who comes in with a history of dysphasia that he feels may have been getting worse over the last few years.  The patient was seen by ENT and referred to see me.  The patient will be set up for an EGD to rule out any esophageal strictures versus possible EOE.  The patient has been explained the differential diagnosis including a Schatzki's ring,  esophageal stricture, eosinophilic esophagitis and motility disorder.  Neoplasm is much less likely.  The patient has been explained the plan and agrees with it.  Midge Miniumarren Cheyne Bungert, MD. Clementeen GrahamFACG    Note: This dictation was prepared with Dragon dictation along with smaller phrase technology. Any transcriptional errors that result from this process are unintentional.

## 2018-04-08 ENCOUNTER — Encounter: Payer: Self-pay | Admitting: *Deleted

## 2018-04-08 ENCOUNTER — Encounter: Payer: Self-pay | Admitting: Anesthesiology

## 2018-04-08 ENCOUNTER — Other Ambulatory Visit: Payer: Self-pay

## 2018-04-13 ENCOUNTER — Ambulatory Visit
Admission: RE | Admit: 2018-04-13 | Payer: BC Managed Care – PPO | Source: Home / Self Care | Admitting: Gastroenterology

## 2018-04-13 SURGERY — ESOPHAGOGASTRODUODENOSCOPY (EGD) WITH PROPOFOL
Anesthesia: Choice

## 2018-04-24 ENCOUNTER — Encounter
Admission: RE | Admit: 2018-04-24 | Discharge: 2018-04-24 | Disposition: A | Discharge status: Home / Self Care | Payer: BC Managed Care – PPO | Source: Ambulatory Visit | Attending: Unknown Physician Specialty | Admitting: Unknown Physician Specialty

## 2018-04-24 ENCOUNTER — Other Ambulatory Visit: Payer: Self-pay

## 2018-04-24 HISTORY — DX: Sleep apnea, unspecified: G47.30

## 2018-04-24 HISTORY — DX: Pyothorax without fistula: J86.9

## 2018-04-24 NOTE — Patient Instructions (Signed)
Your procedure is scheduled on: 04-28-18 TUESDAY Report to Same Day Surgery 2nd floor medical mall [facility](Medical Mall Entrance-take elevator on left to 2nd floor.  Check in with surgery information desk.) To find out your arrival time please call [phone] between 1PM - 3PM on 04-27-18 MONDAY  Remember: Instructions that are not followed completely may result in serious medical risk, up to and including death, or upon the discretion of your surgeon and anesthesiologist your surgery may need to be rescheduled.    _x___ 1. Do not eat food after midnight the night before your procedure. NO GUM OR CANDY AFTER MIDNIGHT. You may drink clear liquids up to 2 hours before you are scheduled to arrive at the hospital for your procedure.  Do not drink clear liquids within 2 hours of your scheduled arrival to the hospital.  Clear liquids include  --Water or Apple juice without pulp  --Clear carbohydrate beverage such as ClearFast or Gatorade  --Black Coffee or Clear Tea (No milk, no creamers, do not add anything to the coffee or Tea   ____Ensure clear carbohydrate drink on the way to the hospital for bariatric patients  ____Ensure clear carbohydrate drink 3 hours before surgery for Dr Rutherford NailByrnett's patients if physician instructed.     __x__ 2. No Alcohol for 24 hours before or after surgery.   __x__3. No Smoking or e-cigarettes for 24 prior to surgery.  Do not use any chewable tobacco products for at least 6 hour prior to surgery   ____  4. Bring all medications with you on the day of surgery if instructed.    __x__ 5. Notify your doctor if there is any change in your medical condition     (cold, fever, infections).    x___6. On the morning of surgery brush your teeth with toothpaste and water.  You may rinse your mouth with mouth wash if you wish.  Do not swallow any toothpaste or mouthwash.   Do not wear jewelry, make-up, hairpins, clips or nail polish.  Do not wear lotions, powders, or perfumes. You  may wear deodorant.  Do not shave 48 hours prior to surgery. Men may shave face and neck.  Do not bring valuables to the hospital.    [facility] any belongings or valuables.               Contacts, dentures or bridgework may not be worn into surgery.  Leave your suitcase in the car. After surgery it may be brought to your room.  For patients admitted to the hospital, discharge time is determined by your  treatment team.  _  Patients discharged the day of surgery will not be allowed to drive home.  You will need someone to drive you home and stay with you the night of your procedure.    Please read over the following fact sheets that you were given:   [facility]  ____ Take anti-hypertensive listed below, cardiac, seizure, asthma, anti-reflux and psychiatric medicines. These include:  1. NONE  2.  3.  4.  5.  6.  ____Fleets enema or Magnesium Citrate as directed.   ____ Use CHG Soap or sage wipes as directed on instruction sheet   ____ Use inhalers on the day of surgery and bring to hospital day of surgery  ____ Stop Metformin and Janumet 2 days prior to surgery.    ____ Take 1/2 of usual insulin dose the night before surgery and none on  the morning surgery.   ____ Follow recommendations from Cardiologist, Pulmonologist or PCP regarding stopping Aspirin, Coumadin, Plavix ,Eliquis, Effient, or Pradaxa, and Pletal.  X____Stop Anti-inflammatories such as Advil, Aleve, Ibuprofen, Motrin, Naproxen, Naprosyn, Goodies powders or aspirin products NOW-OK to take Tylenol    ____ Stop supplements until after surgery.    ____ Bring C-Pap to the hospital.

## 2018-04-28 ENCOUNTER — Other Ambulatory Visit: Payer: Self-pay

## 2018-04-28 ENCOUNTER — Ambulatory Visit: Payer: BC Managed Care – PPO | Admitting: Anesthesiology

## 2018-04-28 ENCOUNTER — Encounter: Payer: Self-pay | Admitting: *Deleted

## 2018-04-28 ENCOUNTER — Ambulatory Visit
Admission: RE | Admit: 2018-04-28 | Discharge: 2018-04-28 | Disposition: A | Discharge status: Home / Self Care | Payer: BC Managed Care – PPO | Attending: Unknown Physician Specialty | Admitting: Unknown Physician Specialty

## 2018-04-28 ENCOUNTER — Encounter
Admission: RE | Disposition: A | Discharge status: Home / Self Care | Payer: Self-pay | Source: Home / Self Care | Attending: Unknown Physician Specialty

## 2018-04-28 DIAGNOSIS — G4730 Sleep apnea, unspecified: Secondary | ICD-10-CM | POA: Diagnosis not present

## 2018-04-28 DIAGNOSIS — J343 Hypertrophy of nasal turbinates: Secondary | ICD-10-CM | POA: Insufficient documentation

## 2018-04-28 DIAGNOSIS — J3489 Other specified disorders of nose and nasal sinuses: Secondary | ICD-10-CM | POA: Diagnosis not present

## 2018-04-28 DIAGNOSIS — J342 Deviated nasal septum: Secondary | ICD-10-CM | POA: Diagnosis not present

## 2018-04-28 DIAGNOSIS — Z825 Family history of asthma and other chronic lower respiratory diseases: Secondary | ICD-10-CM | POA: Insufficient documentation

## 2018-04-28 DIAGNOSIS — Z836 Family history of other diseases of the respiratory system: Secondary | ICD-10-CM | POA: Insufficient documentation

## 2018-04-28 HISTORY — PX: NASAL SEPTOPLASTY W/ TURBINOPLASTY: SHX2070

## 2018-04-28 SURGERY — SEPTOPLASTY, NOSE, WITH NASAL TURBINATE REDUCTION
Anesthesia: General | Laterality: Bilateral

## 2018-04-28 MED ORDER — LIDOCAINE HCL (CARDIAC) PF 100 MG/5ML IV SOSY
PREFILLED_SYRINGE | INTRAVENOUS | Status: DC | PRN
  Administered 2018-04-28: 100 mg via INTRAVENOUS

## 2018-04-28 MED ORDER — HYDROCODONE-ACETAMINOPHEN 5-300 MG PO TABS: 1.0000 | ORAL_TABLET | ORAL | 0 refills | Status: DC | PRN

## 2018-04-28 MED ORDER — ROCURONIUM BROMIDE 100 MG/10ML IV SOLN
INTRAVENOUS | Status: DC | PRN
  Administered 2018-04-28: 20 mg via INTRAVENOUS

## 2018-04-28 MED ORDER — MIDAZOLAM HCL 2 MG/2ML IJ SOLN
INTRAMUSCULAR | Status: AC
  Filled 2018-04-28: qty 2

## 2018-04-28 MED ORDER — PROMETHAZINE HCL 25 MG/ML IJ SOLN: 6.2500 mg | INTRAMUSCULAR | Status: DC | PRN

## 2018-04-28 MED ORDER — SUCCINYLCHOLINE CHLORIDE 20 MG/ML IJ SOLN
INTRAMUSCULAR | Status: DC | PRN
  Administered 2018-04-28: 120 mg via INTRAVENOUS

## 2018-04-28 MED ORDER — MIDAZOLAM HCL 2 MG/2ML IJ SOLN
INTRAMUSCULAR | Status: DC | PRN
  Administered 2018-04-28: 2 mg via INTRAVENOUS

## 2018-04-28 MED ORDER — SULFAMETHOXAZOLE-TRIMETHOPRIM 800-160 MG PO TABS: 1.0000 | ORAL_TABLET | Freq: Two times a day (BID) | ORAL | 0 refills | Status: DC

## 2018-04-28 MED ORDER — FAMOTIDINE 20 MG PO TABS
ORAL_TABLET | ORAL | Status: AC
  Filled 2018-04-28: qty 1

## 2018-04-28 MED ORDER — SUGAMMADEX SODIUM 500 MG/5ML IV SOLN
INTRAVENOUS | Status: DC | PRN
  Administered 2018-04-28: 230 mg via INTRAVENOUS

## 2018-04-28 MED ORDER — LIDOCAINE-EPINEPHRINE 1 %-1:100000 IJ SOLN
INTRAMUSCULAR | Status: AC
  Filled 2018-04-28: qty 1

## 2018-04-28 MED ORDER — LIDOCAINE HCL (PF) 4 % IJ SOLN
INTRAMUSCULAR | Status: AC
  Filled 2018-04-28: qty 5

## 2018-04-28 MED ORDER — LABETALOL HCL 5 MG/ML IV SOLN
INTRAVENOUS | Status: AC
  Administered 2018-04-28: 5 mg via INTRAVENOUS
  Filled 2018-04-28: qty 4

## 2018-04-28 MED ORDER — ONDANSETRON HCL 4 MG/2ML IJ SOLN
INTRAMUSCULAR | Status: DC | PRN
  Administered 2018-04-28: 4 mg via INTRAVENOUS

## 2018-04-28 MED ORDER — OXYMETAZOLINE HCL 0.05 % NA SOLN
NASAL | Status: AC
  Filled 2018-04-28: qty 30

## 2018-04-28 MED ORDER — FENTANYL CITRATE (PF) 100 MCG/2ML IJ SOLN
INTRAMUSCULAR | Status: AC
  Filled 2018-04-28: qty 2

## 2018-04-28 MED ORDER — LIDOCAINE-EPINEPHRINE 1 %-1:100000 IJ SOLN
INTRAMUSCULAR | Status: DC | PRN
  Administered 2018-04-28: 13 mL

## 2018-04-28 MED ORDER — FENTANYL CITRATE (PF) 100 MCG/2ML IJ SOLN
25.0000 ug | INTRAMUSCULAR | Status: DC | PRN
  Administered 2018-04-28: 25 ug via INTRAVENOUS

## 2018-04-28 MED ORDER — LABETALOL HCL 5 MG/ML IV SOLN
5.0000 mg | Freq: Once | INTRAVENOUS | Status: AC
  Administered 2018-04-28: 5 mg via INTRAVENOUS

## 2018-04-28 MED ORDER — FENTANYL CITRATE (PF) 100 MCG/2ML IJ SOLN
INTRAMUSCULAR | Status: DC | PRN
  Administered 2018-04-28 (×2): 50 ug via INTRAVENOUS

## 2018-04-28 MED ORDER — PROPOFOL 500 MG/50ML IV EMUL
INTRAVENOUS | Status: AC
  Filled 2018-04-28: qty 50

## 2018-04-28 MED ORDER — HYDROCODONE-ACETAMINOPHEN 5-325 MG PO TABS
ORAL_TABLET | ORAL | Status: AC
  Filled 2018-04-28: qty 1

## 2018-04-28 MED ORDER — HYDROCODONE-ACETAMINOPHEN 5-325 MG PO TABS
1.0000 | ORAL_TABLET | ORAL | Status: DC | PRN
  Administered 2018-04-28: 1 via ORAL

## 2018-04-28 MED ORDER — PROPOFOL 10 MG/ML IV BOLUS
INTRAVENOUS | Status: DC | PRN
  Administered 2018-04-28: 200 mg via INTRAVENOUS

## 2018-04-28 MED ORDER — DEXAMETHASONE SODIUM PHOSPHATE 10 MG/ML IJ SOLN
INTRAMUSCULAR | Status: DC | PRN
  Administered 2018-04-28: 10 mg via INTRAVENOUS

## 2018-04-28 MED ORDER — BACITRACIN ZINC 500 UNIT/GM EX OINT
TOPICAL_OINTMENT | CUTANEOUS | Status: AC
  Filled 2018-04-28: qty 28.35

## 2018-04-28 MED ORDER — LACTATED RINGERS IV SOLN
INTRAVENOUS | Status: DC
  Administered 2018-04-28 (×2): via INTRAVENOUS

## 2018-04-28 MED ORDER — BACITRACIN-NEOMYCIN-POLYMYXIN OINTMENT TUBE
TOPICAL_OINTMENT | CUTANEOUS | Status: DC | PRN
  Administered 2018-04-28: 1 via TOPICAL

## 2018-04-28 MED ORDER — PHENYLEPHRINE HCL 10 % OP SOLN
OPHTHALMIC | Status: DC | PRN
  Administered 2018-04-28: 10 mL via TOPICAL

## 2018-04-28 MED ORDER — FAMOTIDINE 20 MG PO TABS
20.0000 mg | ORAL_TABLET | Freq: Once | ORAL | Status: AC
  Administered 2018-04-28: 20 mg via ORAL

## 2018-04-28 SURGICAL SUPPLY — 26 items
BLADE SURG 15 STRL LF DISP TIS (BLADE) ×1 IMPLANT
BLADE SURG 15 STRL SS (BLADE) ×2
BNDG EYE OVAL (GAUZE/BANDAGES/DRESSINGS) IMPLANT
CANISTER SUCT 1200ML W/VALVE (MISCELLANEOUS) ×3 IMPLANT
COAG SUCT 10F 3.5MM HAND CTRL (MISCELLANEOUS) ×3 IMPLANT
COVER WAND RF STERILE (DRAPES) IMPLANT
DRESSING NASL FOAM PST OP SINU (MISCELLANEOUS) ×2 IMPLANT
DRSG NASAL FOAM POST OP SINU (MISCELLANEOUS) ×6
ELECT REM PT RETURN 9FT ADLT (ELECTROSURGICAL) ×3
ELECTRODE REM PT RTRN 9FT ADLT (ELECTROSURGICAL) ×1 IMPLANT
GLOVE BIO SURGEON STRL SZ7.5 (GLOVE) ×6 IMPLANT
GOWN STRL REUS W/ TWL LRG LVL3 (GOWN DISPOSABLE) ×2 IMPLANT
GOWN STRL REUS W/TWL LRG LVL3 (GOWN DISPOSABLE) ×4
LABEL OR SOLS (LABEL) ×3 IMPLANT
NS IRRIG 500ML POUR BTL (IV SOLUTION) ×3 IMPLANT
PACK HEAD/NECK (MISCELLANEOUS) ×3 IMPLANT
SPLINT NASAL REUTER .5MM (MISCELLANEOUS) ×3 IMPLANT
SPOGE SURGIFLO 8M (HEMOSTASIS)
SPONGE NEURO XRAY DETECT 1X3 (DISPOSABLE) ×3 IMPLANT
SPONGE SURGIFLO 8M (HEMOSTASIS) IMPLANT
SUT CHROMIC 3-0 (SUTURE) ×2
SUT CHROMIC 3-0 KS 27XMFL CR (SUTURE) ×1
SUT ETHILON 3-0 KS 30 BLK (SUTURE) ×3 IMPLANT
SUT PLAIN GUT 4-0 (SUTURE) ×3 IMPLANT
SUTURE CHRMC 3-0 KS 27XMFL CR (SUTURE) ×1 IMPLANT
WATER STERILE IRR 1000ML POUR (IV SOLUTION) ×3 IMPLANT

## 2018-04-28 NOTE — Progress Notes (Signed)
Labetalol given for blood pressure 139/96

## 2018-04-28 NOTE — Anesthesia Procedure Notes (Signed)
Procedure Name: Intubation Date/Time: 04/28/2018 7:33 AM Performed by: Irving Burton, CRNA Pre-anesthesia Checklist: Patient identified, Emergency Drugs available, Suction available and Patient being monitored Patient Re-evaluated:Patient Re-evaluated prior to induction Oxygen Delivery Method: Circle system utilized Preoxygenation: Pre-oxygenation with 100% oxygen Induction Type: IV induction Ventilation: Mask ventilation without difficulty Laryngoscope Size: McGraph and 3 Grade View: Grade I Tube type: Oral Rae Tube size: 7.5 mm Number of attempts: 2 Airway Equipment and Method: Stylet and Video-laryngoscopy Placement Confirmation: ETT inserted through vocal cords under direct vision,  positive ETCO2 and breath sounds checked- equal and bilateral Secured at: 22 cm Tube secured with: Tape Dental Injury: Teeth and Oropharynx as per pre-operative assessment  Difficulty Due To: Difficult Airway- due to immobile epiglottis and Difficult Airway- due to anterior larynx

## 2018-04-28 NOTE — Anesthesia Post-op Follow-up Note (Signed)
Anesthesia QCDR form completed.        

## 2018-04-28 NOTE — Transfer of Care (Signed)
Immediate Anesthesia Transfer of Care Note  Patient: Adrian Hogan  Procedure(s) Performed: SEPTOPLASTY WITH BILATERAL SUBMUCOSAL RESECTION TURBINATE (Bilateral )  Patient Location: PACU  Anesthesia Type:General  Level of Consciousness: sedated  Airway & Oxygen Therapy: Patient connected to face mask oxygen  Post-op Assessment: Post -op Vital signs reviewed and stable  Post vital signs: stable  Last Vitals:  Vitals Value Taken Time  BP 138/90 04/28/2018  8:42 AM  Temp    Pulse 79 04/28/2018  8:42 AM  Resp 19 04/28/2018  8:42 AM  SpO2 100 % 04/28/2018  8:42 AM  Vitals shown include unvalidated device data.  Last Pain:  Vitals:   04/28/18 0605  TempSrc: Oral  PainSc: 0-No pain         Complications: No apparent anesthesia complications

## 2018-04-28 NOTE — H&P (Signed)
The patient's history has been reviewed, patient examined, no change in status, stable for surgery.  Questions were answered to the patients satisfaction.  

## 2018-04-28 NOTE — Anesthesia Preprocedure Evaluation (Signed)
Anesthesia Evaluation  Patient identified by MRN, date of birth, ID band Patient awake    Reviewed: Allergy & Precautions, H&P , NPO status , Patient's Chart, lab work & pertinent test results, reviewed documented beta blocker date and time   History of Anesthesia Complications Negative for: history of anesthetic complications  Airway Mallampati: II  TM Distance: >3 FB Neck ROM: full    Dental  (+) Dental Advidsory Given, Teeth Intact   Pulmonary neg shortness of breath, sleep apnea , neg COPD, neg recent URI,           Cardiovascular Exercise Tolerance: Good negative cardio ROS       Neuro/Psych negative neurological ROS  negative psych ROS   GI/Hepatic negative GI ROS, Neg liver ROS,   Endo/Other  negative endocrine ROS  Renal/GU negative Renal ROS  negative genitourinary   Musculoskeletal   Abdominal   Peds  Hematology negative hematology ROS (+)   Anesthesia Other Findings Past Medical History: No date: Empyema lung (HCC) 02/2014: Pneumonia     Comment:  Developed empyema requiried prolonged chest tube No date: Sleep apnea     Comment:  RECENTLY DX AND DOES NOT HAVE CPAP YET   Reproductive/Obstetrics negative OB ROS                             Anesthesia Physical Anesthesia Plan  ASA: II  Anesthesia Plan: General   Post-op Pain Management:    Induction: Intravenous  PONV Risk Score and Plan: 2 and Ondansetron, Dexamethasone, Midazolam, Promethazine and Treatment may vary due to age or medical condition  Airway Management Planned: Oral ETT  Additional Equipment:   Intra-op Plan:   Post-operative Plan: Extubation in OR  Informed Consent: I have reviewed the patients History and Physical, chart, labs and discussed the procedure including the risks, benefits and alternatives for the proposed anesthesia with the patient or authorized representative who has indicated  his/her understanding and acceptance.     Dental Advisory Given  Plan Discussed with: Anesthesiologist, CRNA and Surgeon  Anesthesia Plan Comments:         Anesthesia Quick Evaluation

## 2018-04-28 NOTE — Discharge Instructions (Addendum)
  AMBULATORY SURGERY  DISCHARGE INSTRUCTIONS   1) The drugs that you were given will stay in your system until tomorrow so for the next 24 hours you should not:  A) Drive an automobile B) Make any legal decisions C) Drink any alcoholic beverage   2) You may resume regular meals tomorrow.  Today it is better to start with liquids and gradually work up to solid foods.  You may eat anything you prefer, but it is better to start with liquids, then soup and crackers, and gradually work up to solid foods.   3) Please notify your doctor immediately if you have any unusual bleeding, trouble breathing, redness and pain at the surgery site, drainage, fever, or pain not relieved by medication.    4) Additional Instructions: TAKE A STOOL SOFTENER TWICE A DAY WHILE TAKING NARCOTIC PAIN MEDICINE TO PREVENT CONSTIPATION   Please contact your physician with any problems or Same Day Surgery at [phone], Monday through Friday 6 am to 4 pm, or [place] [facility] [place] Main number at [phone].   

## 2018-04-28 NOTE — Progress Notes (Signed)
Ch visited briefly w/ family member and pt pre-op. Pt shared that he was having surgery to help with his breathing. Ch provided a compassionate presence w/ pt and family. No f/u needed at this time.    04/28/18 0700  Clinical Encounter Type  Visited With Patient and family together  Visit Type Psychological support;Pre-op  Spiritual Encounters  Spiritual Needs Emotional  Stress Factors  Patient Stress Factors None identified  Family Stress Factors None identified

## 2018-04-28 NOTE — Op Note (Signed)
PREOPERATIVE DIAGNOSIS:  Chronic nasal obstruction.  POSTOPERATIVE DIAGNOSIS:  Chronic nasal obstruction.  SURGEON:  Davina Poke, M.D.  NAME OF PROCEDURE:  1. Nasal septoplasty. 2. Submucous resection of inferior turbinates.  OPERATIVE FINDINGS:  Severe nasal septal deformity, hypertrophy of the inferior turbinates.   DESCRIPTION OF THE PROCEDURE:  Adrian Hogan was identified in the holding area and taken to the operating room and placed in the supine position.  After general endotracheal anesthesia was induced, the table was turned 45 degrees and the patient was placed in a semi-Fowler position.  The nose was then topically anesthetized with Lidocaine, cotton pledgets were placed within each nostril. After approximately 5 minutes, this was removed at which time a local anesthetic of 1% Lidocaine 1:100,000 units of Epinephrine was used to inject the inferior turbinates in the nasal septum. A total of 13 ml was used. Examination of the nose showed a severe left nasal septal deformity and tremendous hypertrophied inferior turbinate.  Beginning on the right hand side a hemitransfixion incision was then created on the leading edge of the septum on the right.  A subperichondrial plane was elevated posteriorly on the left and taken back to the perpendicular plate of the ethmoid where subperiosteal plane was elevated posteriorly on the left. A large septal spur was identified on the left hand side impacting on the inferior turbinate.  An inferior rim of cartilage was removed anteriorly with care taken to leave an anterior strut to prevent nasal collapse. With this strut removed the perpendicular plate of the ethmoid was separated from the quadrangular cartilage. The large septal spur was removed.  The septum was then replaced in the midline. Reinspection through each nostril showed excellent reduction of the septal deformity. A left posterior inferior fenestration was then created to allow hematoma  drainage.  With the septoplasty completed, beginning on the left-hand side, a 15 blade was used to incise along the inferior edge of the inferior turbinate. A superior laterally based flap was then elevated. The underlying conchal bone of mucosa was excised using Knight scissors. The flap was then laid back over the turbinate stump and cauterized using suction cautery. In a similar fashion the submucous resection was performed on the right.  With the submucous resection completed bilaterally and no active bleeding, the hemitransfixion incision was then closed using two interrupted 3-0 chromic sutures.  Plastic nasal septal splints were placed within each nostril and affixed to the septum using a 3-0 nylon suture. Stammberger was then used beneath each inferior turbinate for hemostasis.    The patient tolerated the procedure well, was returned to anesthesia, extubated in the operating room, and taken to the recovery room in stable condition.    CULTURES:  None.  SPECIMENS:  None.  ESTIMATED BLOOD LOSS:  25 cc.  Davina Poke  04/28/2018  8:42 AM

## 2018-05-01 NOTE — Anesthesia Postprocedure Evaluation (Signed)
Anesthesia Post Note  Patient: Adrian Hogan  Procedure(s) Performed: SEPTOPLASTY WITH BILATERAL SUBMUCOSAL RESECTION TURBINATE (Bilateral )  Patient location during evaluation: PACU Anesthesia Type: General Level of consciousness: awake and alert Pain management: pain level controlled Vital Signs Assessment: post-procedure vital signs reviewed and stable Respiratory status: spontaneous breathing, nonlabored ventilation, respiratory function stable and patient connected to nasal cannula oxygen Cardiovascular status: blood pressure returned to baseline and stable Postop Assessment: no apparent nausea or vomiting Anesthetic complications: no     Last Vitals:  Vitals:   04/28/18 1030 04/28/18 1103  BP: 133/83 136/81  Pulse: 60 66  Resp: 14 12  Temp:    SpO2: 98% 100%    Last Pain:  Vitals:   04/28/18 1103  TempSrc:   PainSc: 2                  Lenard Simmer

## 2019-02-09 ENCOUNTER — Telehealth: Payer: Self-pay

## 2019-02-09 NOTE — Telephone Encounter (Signed)
Adrian Hogan (Patient) Adrian Hogan (Patient) Appointment Scheduling - Scheduling Inquiry for Clinic  Summary: lab orders  Reason for CRM: patient is requesting to get blood work done, no orders in. Patient would like to get everything check  Call back [phone]

## 2019-02-09 NOTE — Telephone Encounter (Signed)
This patient hasn't been seen since 2017. He needs to schedule office visit of CPE.

## 2019-02-10 NOTE — Telephone Encounter (Signed)
CPE scheduled for 04/16/2019

## 2019-04-16 ENCOUNTER — Encounter: Payer: BC Managed Care – PPO | Admitting: Family Medicine

## 2019-06-09 ENCOUNTER — Encounter: Payer: BC Managed Care – PPO | Admitting: Family Medicine

## 2019-06-14 ENCOUNTER — Other Ambulatory Visit: Payer: Self-pay

## 2019-06-14 ENCOUNTER — Encounter: Payer: Self-pay | Admitting: Family Medicine

## 2019-06-14 ENCOUNTER — Ambulatory Visit (INDEPENDENT_AMBULATORY_CARE_PROVIDER_SITE_OTHER): Payer: BC Managed Care – PPO | Admitting: Family Medicine

## 2019-06-14 ENCOUNTER — Encounter: Payer: BC Managed Care – PPO | Admitting: Family Medicine

## 2019-06-14 VITALS — BP 138/90 | HR 97 | Temp 97.5°F | Ht 72.0 in | Wt 273.6 lb

## 2019-06-14 DIAGNOSIS — Z136 Encounter for screening for cardiovascular disorders: Secondary | ICD-10-CM

## 2019-06-14 DIAGNOSIS — R5383 Other fatigue: Secondary | ICD-10-CM

## 2019-06-14 DIAGNOSIS — Z114 Encounter for screening for human immunodeficiency virus [HIV]: Secondary | ICD-10-CM | POA: Diagnosis not present

## 2019-06-14 DIAGNOSIS — Z6837 Body mass index (BMI) 37.0-37.9, adult: Secondary | ICD-10-CM | POA: Diagnosis not present

## 2019-06-14 DIAGNOSIS — E669 Obesity, unspecified: Secondary | ICD-10-CM | POA: Diagnosis not present

## 2019-06-14 DIAGNOSIS — Z0000 Encounter for general adult medical examination without abnormal findings: Secondary | ICD-10-CM

## 2019-06-14 DIAGNOSIS — J301 Allergic rhinitis due to pollen: Secondary | ICD-10-CM

## 2019-06-14 MED ORDER — MONTELUKAST SODIUM 10 MG PO TABS: 10.0000 mg | ORAL_TABLET | Freq: Every day | ORAL | 3 refills | Status: DC

## 2019-06-14 NOTE — Patient Instructions (Addendum)
. Please review the attached list of medications and notify my office if there are any errors.   It is recommended to engage in 150 minutes of moderate exercise every week.   . Your target weight for your height is 222 lb   You can take both the Singulair and an OTC antihistamine such as Claritin, Zyrtec, or Allegra every day for allergies.    Allergic Rhinitis, Adult Allergic rhinitis is a reaction to allergens in the air. Allergens are tiny specks (particles) in the air that cause your body to have an allergic reaction. This condition cannot be passed from person to person (is not contagious). Allergic rhinitis cannot be cured, but it can be controlled. There are two types of allergic rhinitis:  Seasonal. This type is also called hay fever. It happens only during certain times of the year.  Perennial. This type can happen at any time of the year. What are the causes? This condition may be caused by:  Pollen from grasses, trees, and weeds.  House dust mites.  Pet dander.  Mold. What are the signs or symptoms? Symptoms of this condition include:  Sneezing.  Runny or stuffy nose (nasal congestion).  A lot of mucus in the back of the throat (postnasal drip).  Itchy nose.  Tearing of the eyes.  Trouble sleeping.  Being sleepy during day. How is this treated? There is no cure for this condition. You should avoid things that trigger your symptoms (allergens). Treatment can help to relieve symptoms. This may include:  Medicines that block allergy symptoms, such as antihistamines. These may be given as a shot, nasal spray, or pill.  Shots that are given until your body becomes less sensitive to the allergen (desensitization).  Stronger medicines, if all other treatments have not worked. Follow these instructions at home: Avoiding allergens   Find out what you are allergic to. Common allergens include smoke, dust, and pollen.  Avoid them if you can. These are some of  the things that you can do to avoid allergens: ? Replace carpet with wood, tile, or vinyl flooring. Carpet can trap dander and dust. ? Clean any mold found in the home. ? Do not smoke. Do not allow smoking in your home. ? Change your heating and air conditioning filter at least once a month. ? During allergy season:  Keep windows closed as much as you can. If possible, use air conditioning when there is a lot of pollen in the air.  Use a special filter for allergies with your furnace and air conditioner.  Plan outdoor activities when pollen counts are lowest. This is usually during the early morning or evening hours.  If you do go outdoors when pollen count is high, wear a special mask for people with allergies.  When you come indoors, take a shower and change your clothes before sitting on furniture or bedding. General instructions  Do not use fans in your home.  Do not hang clothes outside to dry.  Wear sunglasses to keep pollen out of your eyes.  Wash your hands right away after you touch household pets.  Take over-the-counter and prescription medicines only as told by your doctor.  Keep all follow-up visits as told by your doctor. This is important. Contact a doctor if:  You have a fever.  You have a cough that does not go away (is persistent).  You start to make whistling sounds when you breathe (wheeze).  Your symptoms do not get better with treatment.  You have thick fluid coming from your nose.  You start to have nosebleeds. Get help right away if:  Your tongue or your lips are swollen.  You have trouble breathing.  You feel dizzy or you feel like you are going to pass out (faint).  You have cold sweats. Summary  Allergic rhinitis is a reaction to allergens in the air.  This condition may be caused by allergens. These include pollen, dust mites, pet dander, and mold.  Symptoms include a runny, itchy nose, sneezing, or tearing eyes. You may also have  trouble sleeping or feel sleepy during the day.  Treatment includes taking medicines and avoiding allergens. You may also get shots or take stronger medicines.  Get help if you have a fever or a cough that does not stop. Get help right away if you are short of breath. This information is not intended to replace advice given to you by your health care provider. Make sure you discuss any questions you have with your health care provider. Document Revised: 06/30/2018 Document Reviewed: 09/30/2017 Elsevier Patient Education  [employer].

## 2019-06-14 NOTE — Progress Notes (Signed)
Patient: Adrian Hogan, Male    DOB: [birth year], 44 y.o.   MRN: [id number] Visit Date: 06/14/2019  Today's Provider: [name], MD   Chief Complaint  Patient presents with  . Annual Exam   Subjective:     Annual physical exam Adrian Hogan is a 44 y.o. male who presents today for health maintenance and complete physical. He feels well. He reports not exercising . He reports he is sleeping well.  -----------------------------------------------------------------   Review of Systems  Constitutional: Positive for fatigue.  HENT: Negative.   Eyes: Negative.   Respiratory: Negative.   Cardiovascular: Negative.   Gastrointestinal: Negative.   Endocrine: Negative.   Genitourinary: Negative.   Musculoskeletal: Negative.   Skin: Negative.   Allergic/Immunologic: Negative.   Neurological: Negative.   Hematological: Negative.   Psychiatric/Behavioral: Negative.     Social History      He  reports that he has never smoked. He has never used smokeless tobacco. He reports that he does not drink alcohol or use drugs.       Social History   Socioeconomic History  . Marital status: Married    Spouse name: Not on file  . Number of children: 1  . Years of education: Not on file  . Highest education level: Not on file  Occupational History  . Occupation: Chief Financial Officer: [employer]  Tobacco Use  . Smoking status: Never Smoker  . Smokeless tobacco: Never Used  Substance and Sexual Activity  . Alcohol use: No  . Drug use: No  . Sexual activity: Not on file  Other Topics Concern  . Not on file  Social History Narrative  . Not on file   Social Determinants of Health   Financial Resource Strain:   . Difficulty of Paying Living Expenses:   Food Insecurity:   . Worried About Programme researcher, broadcasting/film/video in the Last Year:   . Barista in the Last Year:   Transportation Needs:   . Freight forwarder (Medical):   Marland Kitchen Lack of Transportation (Non-Medical):   Physical  Activity:   . Days of Exercise per Week:   . Minutes of Exercise per Session:   Stress:   . Feeling of Stress :   Social Connections:   . Frequency of Communication with Friends and Family:   . Frequency of Social Gatherings with Friends and Family:   . Attends Religious Services:   . Active Member of Clubs or Organizations:   . Attends Banker Meetings:   Marland Kitchen Marital Status:     Past Medical History:  Diagnosis Date  . Empyema lung (HCC)   . History of nasal surgery   . Pneumonia 02/2014   Developed empyema requiried prolonged chest tube  . Sleep apnea    RECENTLY DX AND DOES NOT HAVE CPAP YET     Patient Active Problem List   Diagnosis Date Noted  . Allergic rhinitis 05/18/2015  . Family history of hypothyroidism 05/18/2015    Past Surgical History:  Procedure Laterality Date  . ESOPHAGOGASTRODUODENOSCOPY  02/2014  . NASAL SEPTOPLASTY W/ TURBINOPLASTY Bilateral 04/28/2018   Procedure: SEPTOPLASTY WITH BILATERAL SUBMUCOSAL RESECTION TURBINATE;  Surgeon: Linus Salmons, MD;  Location: [facility] ORS;  Service: ENT;  Laterality: Bilateral;  . THORACOTOMY  02/28/2014  . VASECTOMY  2016    Family History        Family Status  Relation Name Status  . Mother  Alive  . Brother half brother Alive  . Daughter  Alive  . MGM  Alive  . MGF  Alive        His family history includes Arthritis in his maternal grandmother; Luiz Blare' disease in his brother.      No Known Allergies   Current Outpatient Medications:  .  loratadine (CLARITIN) 10 MG tablet, Take 10 mg by mouth daily., Disp: , Rfl:    Patient Care Team: Malva Limes, MD as PCP - General (Family Medicine)    Objective:    Vitals: BP 138/90 (BP Location: Right Arm, Patient Position: Sitting, Cuff Size: Large)   Pulse 97   Temp (!) 97.5 F (36.4 C) (Temporal)   Ht 6' (1.829 m)   Wt 273 lb 9.6 oz (124.1 kg)   BMI 37.11 kg/m    Vitals:   06/14/19 1504  BP: 138/90  Pulse: 97  Temp: (!) 97.5  F (36.4 C)  TempSrc: Temporal  Weight: 273 lb 9.6 oz (124.1 kg)  Height: 6' (1.829 m)     Physical Exam   General Appearance:    Obese male. Alert, cooperative, in no acute distress, appears stated age  Head:    Normocephalic, without obvious abnormality, atraumatic  Eyes:    PERRL, conjunctiva/corneas clear, EOM's intact, fundi    benign, both eyes       Ears:    Normal TM's and external ear canals, both ears  Nose:   Nares normal, septum midline, mucosa normal, no drainage   or sinus tenderness  Throat:   Lips, mucosa, and tongue normal; teeth and gums normal  Neck:   Supple, symmetrical, trachea midline, no adenopathy;       thyroid:  No enlargement/tenderness/nodules; no carotid   bruit or JVD  Back:     Symmetric, no curvature, ROM normal, no CVA tenderness  Lungs:     Clear to auscultation bilaterally, respirations unlabored  Chest wall:    No tenderness or deformity  Heart:    Normal heart rate. Normal rhythm. No murmurs, rubs, or gallops.  S1 and S2 normal  Abdomen:     Soft, non-tender, bowel sounds active all four quadrants,    no masses, no organomegaly  Genitalia:    deferred  Rectal:    deferred  Extremities:   All extremities are intact. No cyanosis or edema  Pulses:   2+ and symmetric all extremities  Skin:   Skin color, texture, turgor normal, no rashes or lesions  Lymph nodes:   Cervical, supraclavicular, and axillary nodes normal  Neurologic:   CNII-XII intact. Normal strength, sensation and reflexes      throughout    Depression Screen PHQ 2/9 Scores 06/14/2019  PHQ - 2 Score 0       Assessment & Plan:     Routine Health Maintenance and Physical Exam  Exercise Activities and Dietary recommendations Goals   None     Immunization History  Administered Date(s) Administered  . Influenza,inj,Quad PF,6+ Mos 01/06/2013  . Influenza-Unspecified 01/03/2015  . Tdap 01/27/2013    Health Maintenance  Topic Date Due  . HIV Screening  Never done    . INFLUENZA VACCINE  10/24/2018  . TETANUS/TDAP  01/28/2023     Discussed health benefits of physical activity, and encouraged him to engage in regular exercise appropriate for his age and condition.    1. Encounter for special screening examination for cardiovascular disorder  - Lipid panel  2. Encounter for screening for  HIV  - HIV Antibody (routine testing w rflx)  3. Seasonal allergic rhinitis due to pollen  - loratadine (CLARITIN) 10 MG tablet; Take 10 mg by mouth daily. - montelukast (SINGULAIR) 10 MG tablet; Take 1 tablet (10 mg total) by mouth at bedtime.  Dispense: 30 tablet; Refill: 3  4. Other fatigue  - CBC - Comprehensive metabolic panel - TSH - Testosterone,Free and Total  5. Class 2 obesity without serious comorbidity with body mass index (BMI) of 37.0 to 37.9 in adult, unspecified obesity type Counseled regarding prudent diet and regular exercise.    6. Annual physical exam  - CBC - Comprehensive metabolic panel - TSH - Testosterone,Free and Total  Lelon Huh, MD  [facility]

## 2019-06-16 ENCOUNTER — Other Ambulatory Visit: Payer: Self-pay | Admitting: Family Medicine

## 2019-06-16 DIAGNOSIS — E785 Hyperlipidemia, unspecified: Secondary | ICD-10-CM | POA: Insufficient documentation

## 2019-06-16 DIAGNOSIS — E291 Testicular hypofunction: Secondary | ICD-10-CM | POA: Insufficient documentation

## 2019-06-18 LAB — COMPREHENSIVE METABOLIC PANEL
ALT: 62 IU/L — ABNORMAL HIGH (ref 0–44)
AST: 35 IU/L (ref 0–40)
Albumin/Globulin Ratio: 1.8 (ref 1.2–2.2)
Albumin: 4.5 g/dL (ref 4.0–5.0)
Alkaline Phosphatase: 119 IU/L — ABNORMAL HIGH (ref 39–117)
BUN/Creatinine Ratio: 16 (ref 9–20)
BUN: 18 mg/dL (ref 6–24)
Bilirubin Total: 0.3 mg/dL (ref 0.0–1.2)
CO2: 24 mmol/L (ref 20–29)
Calcium: 9.3 mg/dL (ref 8.7–10.2)
Chloride: 104 mmol/L (ref 96–106)
Creatinine, Ser: 1.12 mg/dL (ref 0.76–1.27)
GFR calc Af Amer: 93 mL/min/{1.73_m2} (ref >59)
GFR calc non Af Amer: 81 mL/min/{1.73_m2} (ref >59)
Globulin, Total: 2.5 g/dL (ref 1.5–4.5)
Glucose: 115 mg/dL — ABNORMAL HIGH (ref 65–99)
Potassium: 4.1 mmol/L (ref 3.5–5.2)
Sodium: 141 mmol/L (ref 134–144)
Total Protein: 7 g/dL (ref 6.0–8.5)

## 2019-06-18 LAB — CBC
Hematocrit: 40.9 % (ref 37.5–51.0)
Hemoglobin: 14.4 g/dL (ref 13.0–17.7)
MCH: 30.8 pg (ref 26.6–33.0)
MCHC: 35.2 g/dL (ref 31.5–35.7)
MCV: 87 fL (ref 79–97)
Platelets: 213 10*3/uL (ref 150–450)
RBC: 4.68 x10E6/uL (ref 4.14–5.80)
RDW: 13.4 % (ref 11.6–15.4)
WBC: 7 10*3/uL (ref 3.4–10.8)

## 2019-06-18 LAB — TESTOSTERONE,FREE AND TOTAL
Testosterone, Free: 6 pg/mL — ABNORMAL LOW (ref 6.8–21.5)
Testosterone: 139 ng/dL — ABNORMAL LOW (ref 264–916)

## 2019-06-18 LAB — LIPID PANEL
Chol/HDL Ratio: 6 ratio — ABNORMAL HIGH (ref 0.0–5.0)
Cholesterol, Total: 227 mg/dL — ABNORMAL HIGH (ref 100–199)
HDL: 38 mg/dL — ABNORMAL LOW (ref >39)
LDL Chol Calc (NIH): 149 mg/dL — ABNORMAL HIGH (ref 0–99)
Triglycerides: 218 mg/dL — ABNORMAL HIGH (ref 0–149)
VLDL Cholesterol Cal: 40 mg/dL (ref 5–40)

## 2019-06-18 LAB — HIV ANTIBODY (ROUTINE TESTING W REFLEX): HIV Screen 4th Generation wRfx: NONREACTIVE

## 2019-06-18 LAB — TSH: TSH: 3.04 u[IU]/mL (ref 0.450–4.500)

## 2019-06-18 MED ORDER — TESTOSTERONE 4 MG/24HR TD PT24: 1.0000 | MEDICATED_PATCH | Freq: Every day | TRANSDERMAL | 1 refills | Status: DC

## 2019-06-18 NOTE — Addendum Note (Signed)
Addended by: Malva Limes on: 06/18/2019 01:22 PM   Modules accepted: Orders

## 2019-06-20 LAB — PROLACTIN: Prolactin: 11.8 ng/mL (ref 4.0–15.2)

## 2019-06-20 LAB — PSA: Prostate Specific Ag, Serum: 0.6 ng/mL (ref 0.0–4.0)

## 2019-06-20 LAB — TESTOSTERONE,FREE AND TOTAL
Testosterone, Free: 4 pg/mL — ABNORMAL LOW (ref 6.8–21.5)
Testosterone: 181 ng/dL — ABNORMAL LOW (ref 264–916)

## 2019-06-21 ENCOUNTER — Telehealth: Payer: Self-pay

## 2019-06-21 NOTE — Telephone Encounter (Signed)
PA completed via covermymeds. Awaiting response from insurance.

## 2019-06-21 NOTE — Telephone Encounter (Signed)
Copied from CRM [id number]. Topic: General - Inquiry >> Jun 21, 2019 12:21 PM Reggie Pile, NT wrote: Reason for CRM: Patient called in stating he is needing a PA on testosterone (ANDRODERM) 4 MG/24HR PT24 patch. Please advise

## 2019-06-21 NOTE — Telephone Encounter (Signed)
PA approved from 06/21/2019 - 06/21/2022. Pharmacy and patient notified.

## 2019-07-27 NOTE — Progress Notes (Signed)
    I,Roshena L Chambers,acting as a scribe for [name], MD.,have documented all relevant documentation on the behalf of [name], MD,as directed by  [name], MD while in the presence of [name], MD.   Established patient visit   Patient: Adrian Hogan   DOB: 12-Oct-1975   44 y.o. Male  MRN: [id number] Visit Date: 07/30/2019  Today's healthcare provider: [name], MD   Chief Complaint  Patient presents with  . Low Testosterone   Subjective    HPI Follow up for Low testosterone:  The patient was last seen for this 6 weeks ago. Changes made at last visit include starting Androderm patch.  He reports good compliance with treatment. He feels that condition is Unchanged. He is having side effects.Patient reports having a mild red itchy skin irritation from where the patch is placed on the skin. Skin irritation usually goes away after 3-4 days.  He has been placing patches on at night.   He would like to try changing to Testosterone injections due to patches causing skin irritations. -----------------------------------------------------------------------------------------      Medications: Outpatient Medications Prior to Visit  Medication Sig  . loratadine (CLARITIN) 10 MG tablet Take 10 mg by mouth daily.  . montelukast (SINGULAIR) 10 MG tablet Take 1 tablet (10 mg total) by mouth at bedtime.  Marland Kitchen testosterone (ANDRODERM) 4 MG/24HR PT24 patch Place 1 patch onto the skin daily.   No facility-administered medications prior to visit.    Review of Systems  Constitutional: Negative for appetite change, chills and fever.  Respiratory: Negative for chest tightness, shortness of breath and wheezing.   Cardiovascular: Negative for chest pain and palpitations.  Gastrointestinal: Negative for abdominal pain, nausea and vomiting.      Objective    BP 134/90   Pulse 79   Temp (!) 97.5 F (36.4 C) (Temporal)   Resp 16   Wt 264 lb (119.7 kg)   SpO2 96%  Comment: room air  BMI 35.80 kg/m    Physical Exam  General appearance: Overweight male, cooperative and in no acute distress Head: Normocephalic, without obvious abnormality, atraumatic Respiratory: Respirations even and unlabored, normal respiratory rate Extremities: All extremities are intact.  Skin: Skin color, texture, turgor normal. No rashes seen  Psych: Appropriate mood and affect. Neurologic: Mental status: Alert, oriented to person, place, and time, thought content appropriate.  No results found for any visits on 07/30/19.  Assessment & Plan     1. Hypogonadism in male Will recheck labs today. Will consider switching to testosterone injections after pending labs due to the patches causing mild skin irritation.  - Comprehensive Metabolic Panel (CMET) - Testosterone,Free and Total - Hepatic function panel - CBC  No follow-ups on file.      The entirety of the information documented in the History of Present Illness, Review of Systems and Physical Exam were personally obtained by me. Portions of this information were initially documented by the CMA and reviewed by me for thoroughness and accuracy.      [name], MD  [facility] [phone] (phone) [phone] (fax)  [facility]

## 2019-07-30 ENCOUNTER — Encounter: Payer: Self-pay | Admitting: Family Medicine

## 2019-07-30 ENCOUNTER — Other Ambulatory Visit: Payer: Self-pay

## 2019-07-30 ENCOUNTER — Ambulatory Visit (INDEPENDENT_AMBULATORY_CARE_PROVIDER_SITE_OTHER): Payer: BC Managed Care – PPO | Admitting: Family Medicine

## 2019-07-30 VITALS — BP 134/90 | HR 79 | Temp 97.5°F | Resp 16 | Wt 264.0 lb

## 2019-07-30 DIAGNOSIS — E291 Testicular hypofunction: Secondary | ICD-10-CM | POA: Diagnosis not present

## 2019-07-30 NOTE — Patient Instructions (Signed)
.   Please review the attached list of medications and notify my office if there are any errors.   . Please bring all of your medications to every appointment so we can make sure that our medication list is the same as yours.   

## 2019-08-01 LAB — COMPREHENSIVE METABOLIC PANEL
ALT: 37 IU/L (ref 0–44)
AST: 21 IU/L (ref 0–40)
Albumin/Globulin Ratio: 1.6 (ref 1.2–2.2)
Albumin: 4.5 g/dL (ref 4.0–5.0)
Alkaline Phosphatase: 122 IU/L — ABNORMAL HIGH (ref 39–117)
BUN/Creatinine Ratio: 13 (ref 9–20)
BUN: 15 mg/dL (ref 6–24)
Bilirubin Total: 0.4 mg/dL (ref 0.0–1.2)
CO2: 22 mmol/L (ref 20–29)
Calcium: 9.3 mg/dL (ref 8.7–10.2)
Chloride: 107 mmol/L — ABNORMAL HIGH (ref 96–106)
Creatinine, Ser: 1.18 mg/dL (ref 0.76–1.27)
GFR calc Af Amer: 87 mL/min/{1.73_m2} (ref >59)
GFR calc non Af Amer: 76 mL/min/{1.73_m2} (ref >59)
Globulin, Total: 2.8 g/dL (ref 1.5–4.5)
Glucose: 94 mg/dL (ref 65–99)
Potassium: 4.4 mmol/L (ref 3.5–5.2)
Sodium: 144 mmol/L (ref 134–144)
Total Protein: 7.3 g/dL (ref 6.0–8.5)

## 2019-08-01 LAB — CBC
Hematocrit: 45 % (ref 37.5–51.0)
Hemoglobin: 14.8 g/dL (ref 13.0–17.7)
MCH: 29.9 pg (ref 26.6–33.0)
MCHC: 32.9 g/dL (ref 31.5–35.7)
MCV: 91 fL (ref 79–97)
Platelets: 190 10*3/uL (ref 150–450)
RBC: 4.95 x10E6/uL (ref 4.14–5.80)
RDW: 13.6 % (ref 11.6–15.4)
WBC: 6.8 10*3/uL (ref 3.4–10.8)

## 2019-08-01 LAB — HEPATIC FUNCTION PANEL: Bilirubin, Direct: 0.1 mg/dL (ref 0.00–0.40)

## 2019-08-01 LAB — TESTOSTERONE,FREE AND TOTAL
Testosterone, Free: 16.9 pg/mL (ref 6.8–21.5)
Testosterone: 610 ng/dL (ref 264–916)

## 2019-08-02 ENCOUNTER — Other Ambulatory Visit: Payer: Self-pay | Admitting: Family Medicine

## 2019-08-02 MED ORDER — TESTOSTERONE CYPIONATE 100 MG/ML IM SOLN: 100.0000 mg | INTRAMUSCULAR | 0 refills | Status: DC

## 2019-08-02 MED ORDER — "INSULIN SYRINGE-NEEDLE U-100 25G X 1"" 1 ML MISC": 0 refills | Status: AC

## 2019-08-02 NOTE — Progress Notes (Unsigned)
tests

## 2019-08-25 ENCOUNTER — Encounter: Payer: Self-pay | Admitting: Family Medicine

## 2019-08-25 ENCOUNTER — Other Ambulatory Visit: Payer: Self-pay | Admitting: Family Medicine

## 2019-08-25 DIAGNOSIS — E291 Testicular hypofunction: Secondary | ICD-10-CM

## 2019-08-30 ENCOUNTER — Other Ambulatory Visit: Payer: Self-pay

## 2019-08-30 DIAGNOSIS — E291 Testicular hypofunction: Secondary | ICD-10-CM

## 2019-09-03 LAB — CBC
Hematocrit: 45.1 % (ref 37.5–51.0)
Hemoglobin: 14.8 g/dL (ref 13.0–17.7)
MCH: 29.5 pg (ref 26.6–33.0)
MCHC: 32.8 g/dL (ref 31.5–35.7)
MCV: 90 fL (ref 79–97)
Platelets: 214 10*3/uL (ref 150–450)
RBC: 5.02 x10E6/uL (ref 4.14–5.80)
RDW: 13.2 % (ref 11.6–15.4)
WBC: 6.6 10*3/uL (ref 3.4–10.8)

## 2019-09-03 LAB — TESTOSTERONE,FREE AND TOTAL
Testosterone, Free: 10 pg/mL (ref 6.8–21.5)
Testosterone: 390 ng/dL (ref 264–916)

## 2019-09-03 LAB — HEPATIC FUNCTION PANEL
ALT: 38 IU/L (ref 0–44)
AST: 23 IU/L (ref 0–40)
Albumin: 4.3 g/dL (ref 4.0–5.0)
Alkaline Phosphatase: 121 IU/L (ref 48–121)
Bilirubin Total: 0.4 mg/dL (ref 0.0–1.2)
Bilirubin, Direct: 0.1 mg/dL (ref 0.00–0.40)
Total Protein: 6.9 g/dL (ref 6.0–8.5)

## 2019-09-05 ENCOUNTER — Other Ambulatory Visit: Payer: Self-pay | Admitting: Family Medicine

## 2019-09-05 DIAGNOSIS — J301 Allergic rhinitis due to pollen: Secondary | ICD-10-CM

## 2019-11-19 ENCOUNTER — Other Ambulatory Visit: Payer: Self-pay | Admitting: Family Medicine

## 2019-11-19 ENCOUNTER — Ambulatory Visit: Payer: Self-pay

## 2019-11-19 DIAGNOSIS — U071 COVID-19: Secondary | ICD-10-CM

## 2019-11-19 MED ORDER — AZITHROMYCIN 250 MG PO TABS: ORAL_TABLET | ORAL | 0 refills | Status: AC

## 2019-11-19 NOTE — Telephone Encounter (Signed)
Patients wife Sharyl Nimrod advised as below and verbalized understanding (OK per DPR).

## 2019-11-19 NOTE — Telephone Encounter (Signed)
Pt.'s wife reports pt. Started feeling bad last Wednesday. Tested positive for COVID 19 Monday. Continues to have productive cough with brownish, yellow-green mucus. Low grade temp. 99.Has fatigue. No available virtual visits today. Would like an antibiotic to be sent to pharmacy or possibly be worked in today for virtual visit. Please advise.  Answer Assessment - Initial Assessment Questions 1. COVID-19 DIAGNOSIS: "Who made your Coronavirus (COVID-19) diagnosis?" "Was it confirmed by a positive lab test?" If not diagnosed by a HCP, ask "Are there lots of cases (community spread) where you live?" (See public health department website, if unsure)     [facility] 2. COVID-19 EXPOSURE: "Was there any known exposure to COVID before the symptoms began?" CDC Definition of close contact: within 6 feet (2 meters) for a total of 15 minutes or more over a 24-hour period.      Yes 3. ONSET: "When did the COVID-19 symptoms start?"      LastWednesday 4. WORST SYMPTOM: "What is your worst symptom?" (e.g., cough, fever, shortness of breath, muscle aches)     Cough 5. COUGH: "Do you have a cough?" If Yes, ask: "How bad is the cough?"       Moderate 6. FEVER: "Do you have a fever?" If Yes, ask: "What is your temperature, how was it measured, and when did it start?"     99 7. RESPIRATORY STATUS: "Describe your breathing?" (e.g., shortness of breath, wheezing, unable to speak)      No 8. BETTER-SAME-WORSE: "Are you getting better, staying the same or getting worse compared to yesterday?"  If getting worse, ask, "In what way?"     Better 9. HIGH RISK DISEASE: "Do you have any chronic medical problems?" (e.g., asthma, heart or lung disease, weak immune system, obesity, etc.)     No 10. PREGNANCY: "Is there any chance you are pregnant?" "When was your last menstrual period?"       n/a 11. OTHER SYMPTOMS: "Do you have any other symptoms?"  (e.g., chills, fatigue, headache, loss of smell or taste, muscle pain,  sore throat; new loss of smell or taste especially support the diagnosis of COVID-19)       Congestion, fatigue  Protocols used: CORONAVIRUS (COVID-19) DIAGNOSED OR SUSPECTED-A-AH

## 2019-11-19 NOTE — Telephone Encounter (Signed)
If he has had symptoms for less than 10 days he should be a candidate for monoclonal antibodies since his BMI is >35. . Can call [phone] for more info.   It might help to take antibiotic, so [facility]'ll [facility], but monoclonal antibodies help much more.

## 2019-12-01 ENCOUNTER — Other Ambulatory Visit: Payer: BC Managed Care – PPO

## 2020-01-21 ENCOUNTER — Encounter: Payer: Self-pay | Admitting: Family Medicine

## 2020-01-21 DIAGNOSIS — E291 Testicular hypofunction: Secondary | ICD-10-CM

## 2020-02-01 ENCOUNTER — Other Ambulatory Visit: Payer: Self-pay

## 2020-02-01 DIAGNOSIS — E291 Testicular hypofunction: Secondary | ICD-10-CM

## 2020-02-02 LAB — CBC WITH DIFFERENTIAL/PLATELET
Basophils Absolute: 0.1 10*3/uL (ref 0.0–0.2)
Basos: 1 %
EOS (ABSOLUTE): 0.5 10*3/uL — ABNORMAL HIGH (ref 0.0–0.4)
Eos: 8 %
Hematocrit: 45.6 % (ref 37.5–51.0)
Hemoglobin: 15.1 g/dL (ref 13.0–17.7)
Immature Grans (Abs): 0 10*3/uL (ref 0.0–0.1)
Immature Granulocytes: 0 %
Lymphocytes Absolute: 2.1 10*3/uL (ref 0.7–3.1)
Lymphs: 36 %
MCH: 29.5 pg (ref 26.6–33.0)
MCHC: 33.1 g/dL (ref 31.5–35.7)
MCV: 89 fL (ref 79–97)
Monocytes Absolute: 0.5 10*3/uL (ref 0.1–0.9)
Monocytes: 8 %
Neutrophils Absolute: 2.8 10*3/uL (ref 1.4–7.0)
Neutrophils: 47 %
Platelets: 215 10*3/uL (ref 150–450)
RBC: 5.12 x10E6/uL (ref 4.14–5.80)
RDW: 14.1 % (ref 11.6–15.4)
WBC: 5.9 10*3/uL (ref 3.4–10.8)

## 2020-02-02 LAB — TESTOSTERONE,FREE AND TOTAL
Testosterone, Free: 9.8 pg/mL (ref 6.8–21.5)
Testosterone: 202 ng/dL — ABNORMAL LOW (ref 264–916)

## 2020-02-02 LAB — PSA: Prostate Specific Ag, Serum: 0.9 ng/mL (ref 0.0–4.0)

## 2020-02-04 MED ORDER — TESTOSTERONE CYPIONATE 200 MG/ML IJ SOLN: 1.0000 mL | INTRAMUSCULAR | 4 refills | Status: DC

## 2020-02-14 ENCOUNTER — Other Ambulatory Visit: Payer: BC Managed Care – PPO

## 2020-02-14 DIAGNOSIS — Z20822 Contact with and (suspected) exposure to covid-19: Secondary | ICD-10-CM

## 2020-02-15 LAB — NOVEL CORONAVIRUS, NAA

## 2020-02-21 ENCOUNTER — Other Ambulatory Visit: Payer: BC Managed Care – PPO

## 2020-02-21 DIAGNOSIS — Z20822 Contact with and (suspected) exposure to covid-19: Secondary | ICD-10-CM

## 2020-02-22 LAB — NOVEL CORONAVIRUS, NAA: SARS-CoV-2, NAA: NOT DETECTED

## 2020-02-22 LAB — SARS-COV-2, NAA 2 DAY TAT

## 2020-02-28 ENCOUNTER — Other Ambulatory Visit: Payer: BC Managed Care – PPO

## 2020-02-28 DIAGNOSIS — Z20822 Contact with and (suspected) exposure to covid-19: Secondary | ICD-10-CM

## 2020-02-29 LAB — SARS-COV-2, NAA 2 DAY TAT

## 2020-02-29 LAB — NOVEL CORONAVIRUS, NAA: SARS-CoV-2, NAA: NOT DETECTED

## 2020-03-06 ENCOUNTER — Other Ambulatory Visit: Payer: Self-pay

## 2020-03-06 ENCOUNTER — Other Ambulatory Visit: Payer: BC Managed Care – PPO

## 2020-03-06 DIAGNOSIS — Z20822 Contact with and (suspected) exposure to covid-19: Secondary | ICD-10-CM

## 2020-03-07 LAB — NOVEL CORONAVIRUS, NAA: SARS-CoV-2, NAA: NOT DETECTED

## 2020-03-07 LAB — SARS-COV-2, NAA 2 DAY TAT

## 2020-03-13 ENCOUNTER — Other Ambulatory Visit: Payer: BC Managed Care – PPO

## 2020-03-13 DIAGNOSIS — Z20822 Contact with and (suspected) exposure to covid-19: Secondary | ICD-10-CM

## 2020-03-15 LAB — SARS-COV-2, NAA 2 DAY TAT

## 2020-03-15 LAB — NOVEL CORONAVIRUS, NAA: SARS-CoV-2, NAA: NOT DETECTED

## 2020-03-27 ENCOUNTER — Other Ambulatory Visit: Payer: Self-pay

## 2020-03-27 DIAGNOSIS — Z20822 Contact with and (suspected) exposure to covid-19: Secondary | ICD-10-CM

## 2020-03-29 LAB — SARS-COV-2, NAA 2 DAY TAT

## 2020-03-29 LAB — NOVEL CORONAVIRUS, NAA: SARS-CoV-2, NAA: NOT DETECTED

## 2020-04-03 ENCOUNTER — Other Ambulatory Visit: Payer: Self-pay

## 2020-04-03 DIAGNOSIS — Z20822 Contact with and (suspected) exposure to covid-19: Secondary | ICD-10-CM

## 2020-04-04 ENCOUNTER — Encounter: Payer: Self-pay | Admitting: Family Medicine

## 2020-04-04 LAB — SARS-COV-2, NAA 2 DAY TAT

## 2020-04-04 LAB — NOVEL CORONAVIRUS, NAA: SARS-CoV-2, NAA: NOT DETECTED

## 2020-04-04 LAB — SPECIMEN STATUS REPORT

## 2020-04-05 ENCOUNTER — Other Ambulatory Visit: Payer: Self-pay | Admitting: Family Medicine

## 2020-04-05 DIAGNOSIS — E291 Testicular hypofunction: Secondary | ICD-10-CM

## 2020-04-06 ENCOUNTER — Other Ambulatory Visit: Payer: Self-pay | Admitting: Family Medicine

## 2020-04-07 LAB — CBC
Hematocrit: 51.6 % — ABNORMAL HIGH (ref 37.5–51.0)
Hemoglobin: 17.2 g/dL (ref 13.0–17.7)
MCH: 29.5 pg (ref 26.6–33.0)
MCHC: 33.3 g/dL (ref 31.5–35.7)
MCV: 89 fL (ref 79–97)
Platelets: 210 10*3/uL (ref 150–450)
RBC: 5.83 x10E6/uL — ABNORMAL HIGH (ref 4.14–5.80)
RDW: 13 % (ref 11.6–15.4)
WBC: 7.1 10*3/uL (ref 3.4–10.8)

## 2020-04-07 LAB — TESTOSTERONE,FREE AND TOTAL
Testosterone, Free: 15.1 pg/mL (ref 6.8–21.5)
Testosterone: 449 ng/dL (ref 264–916)

## 2020-04-10 ENCOUNTER — Other Ambulatory Visit: Payer: BC Managed Care – PPO

## 2020-04-11 ENCOUNTER — Other Ambulatory Visit: Payer: Self-pay

## 2020-04-12 ENCOUNTER — Other Ambulatory Visit: Payer: Self-pay

## 2020-04-12 DIAGNOSIS — Z20822 Contact with and (suspected) exposure to covid-19: Secondary | ICD-10-CM

## 2020-04-14 LAB — NOVEL CORONAVIRUS, NAA: SARS-CoV-2, NAA: NOT DETECTED

## 2020-04-14 LAB — SARS-COV-2, NAA 2 DAY TAT

## 2020-04-17 ENCOUNTER — Other Ambulatory Visit: Payer: Self-pay

## 2020-04-17 DIAGNOSIS — Z20822 Contact with and (suspected) exposure to covid-19: Secondary | ICD-10-CM

## 2020-04-18 LAB — SARS-COV-2, NAA 2 DAY TAT

## 2020-04-18 LAB — NOVEL CORONAVIRUS, NAA: SARS-CoV-2, NAA: NOT DETECTED

## 2020-04-24 ENCOUNTER — Other Ambulatory Visit: Payer: Self-pay

## 2020-04-24 DIAGNOSIS — Z20822 Contact with and (suspected) exposure to covid-19: Secondary | ICD-10-CM

## 2020-04-25 LAB — NOVEL CORONAVIRUS, NAA: SARS-CoV-2, NAA: NOT DETECTED

## 2020-04-25 LAB — SARS-COV-2, NAA 2 DAY TAT

## 2020-05-01 ENCOUNTER — Other Ambulatory Visit: Payer: Self-pay

## 2020-05-01 DIAGNOSIS — Z20822 Contact with and (suspected) exposure to covid-19: Secondary | ICD-10-CM

## 2020-05-02 LAB — NOVEL CORONAVIRUS, NAA: SARS-CoV-2, NAA: NOT DETECTED

## 2020-05-02 LAB — SARS-COV-2, NAA 2 DAY TAT

## 2020-05-08 ENCOUNTER — Other Ambulatory Visit: Payer: Self-pay

## 2020-05-08 DIAGNOSIS — Z20822 Contact with and (suspected) exposure to covid-19: Secondary | ICD-10-CM

## 2020-05-09 LAB — SARS-COV-2, NAA 2 DAY TAT

## 2020-05-09 LAB — NOVEL CORONAVIRUS, NAA: SARS-CoV-2, NAA: NOT DETECTED

## 2020-05-15 ENCOUNTER — Other Ambulatory Visit: Payer: Self-pay

## 2020-05-16 ENCOUNTER — Other Ambulatory Visit: Payer: Self-pay

## 2020-05-16 DIAGNOSIS — Z20822 Contact with and (suspected) exposure to covid-19: Secondary | ICD-10-CM

## 2020-05-17 LAB — SARS-COV-2, NAA 2 DAY TAT

## 2020-05-17 LAB — NOVEL CORONAVIRUS, NAA: SARS-CoV-2, NAA: NOT DETECTED

## 2020-05-22 ENCOUNTER — Other Ambulatory Visit: Payer: Self-pay

## 2020-05-23 ENCOUNTER — Other Ambulatory Visit: Payer: Self-pay

## 2020-05-23 DIAGNOSIS — Z20822 Contact with and (suspected) exposure to covid-19: Secondary | ICD-10-CM

## 2020-05-24 LAB — NOVEL CORONAVIRUS, NAA: SARS-CoV-2, NAA: NOT DETECTED

## 2020-05-24 LAB — SARS-COV-2, NAA 2 DAY TAT

## 2020-05-30 ENCOUNTER — Other Ambulatory Visit: Payer: Self-pay

## 2020-06-06 ENCOUNTER — Other Ambulatory Visit: Payer: Self-pay

## 2020-06-13 ENCOUNTER — Other Ambulatory Visit: Payer: Self-pay

## 2020-06-20 ENCOUNTER — Other Ambulatory Visit: Payer: Self-pay

## 2020-07-11 ENCOUNTER — Encounter: Payer: Self-pay | Admitting: Family Medicine

## 2020-07-11 ENCOUNTER — Other Ambulatory Visit: Payer: Self-pay | Admitting: Family Medicine

## 2020-07-11 DIAGNOSIS — E291 Testicular hypofunction: Secondary | ICD-10-CM

## 2020-07-31 LAB — CBC
Hematocrit: 49 % (ref 37.5–51.0)
Hemoglobin: 16.5 g/dL (ref 13.0–17.7)
MCH: 29.9 pg (ref 26.6–33.0)
MCHC: 33.7 g/dL (ref 31.5–35.7)
MCV: 89 fL (ref 79–97)
Platelets: 232 10*3/uL (ref 150–450)
RBC: 5.52 x10E6/uL (ref 4.14–5.80)
RDW: 14.2 % (ref 11.6–15.4)
WBC: 7.1 10*3/uL (ref 3.4–10.8)

## 2020-07-31 LAB — TESTOSTERONE,FREE AND TOTAL
Testosterone, Free: 16.4 pg/mL (ref 6.8–21.5)
Testosterone: 607 ng/dL (ref 264–916)

## 2020-09-07 ENCOUNTER — Other Ambulatory Visit: Payer: Self-pay | Admitting: Family Medicine

## 2020-09-07 DIAGNOSIS — E291 Testicular hypofunction: Secondary | ICD-10-CM

## 2020-09-07 NOTE — Telephone Encounter (Signed)
Requested medication (s) are due for refill today: no  Requested medication (s) are on the active medication list: yes   Last refill: 06/27/2020  Future visit scheduled: no  Notes to clinic: this refill cannot be delegated    Requested Prescriptions  Pending Prescriptions Disp Refills   testosterone cypionate (DEPOTESTOSTERONE CYPIONATE) 200 MG/ML injection [Pharmacy Med Name: TESTOSTERONE CYP 200 MG/ML] 5 mL     Sig: INJECT 1 ML AS DIRECTED EVERY 14 (FOURTEEN) DAYS.      Off-Protocol Failed - 09/07/2020  1:32 AM      Failed - Medication not assigned to a protocol, review manually.      Failed - Valid encounter within last 12 months    Recent Outpatient Visits           1 year ago Hypogonadism in male   [facility] Fisher, Demetrios Isaacs, MD   1 year ago Annual physical exam   [facility] Malva Limes, MD   5 years ago Other fatigue   [facility] Malva Limes, MD

## 2020-09-11 ENCOUNTER — Other Ambulatory Visit: Payer: Self-pay | Admitting: Family Medicine

## 2020-09-11 NOTE — Telephone Encounter (Signed)
Requested medications are due for refill today.  yes  Requested medications are on the active medications list.  yes  Last refill. 08/02/2019  Future visit scheduled.   yes  Notes to clinic.  Prescription is expired. 

## 2021-04-03 ENCOUNTER — Other Ambulatory Visit: Payer: Self-pay | Admitting: Family Medicine

## 2021-04-03 DIAGNOSIS — E291 Testicular hypofunction: Secondary | ICD-10-CM

## 2021-04-03 NOTE — Telephone Encounter (Signed)
Requested medications are due for refill today.  yes  Requested medications are on the active medications list.  yes  Last refill. 09/07/2020  Future visit scheduled.   no  Notes to clinic.  Medication not delegated.    Requested Prescriptions  Pending Prescriptions Disp Refills   testosterone cypionate (DEPOTESTOSTERONE CYPIONATE) 200 MG/ML injection [Pharmacy Med Name: TESTOSTERONE CYP 200 MG/ML] 5 mL     Sig: INJECT 1 ML AS DIRECTED EVERY 14 (FOURTEEN) DAYS.     Off-Protocol Failed - 04/03/2021  7:19 PM      Failed - Medication not assigned to a protocol, review manually.      Failed - Valid encounter within last 12 months    Recent Outpatient Visits           1 year ago Hypogonadism in male   [place], Kirstie Peri, MD   1 year ago Annual physical exam   [facility] Birdie Sons, MD   5 years ago Other fatigue   [facility] Birdie Sons, MD

## 2021-06-17 ENCOUNTER — Other Ambulatory Visit: Payer: Self-pay | Admitting: Family Medicine

## 2021-06-17 DIAGNOSIS — E291 Testicular hypofunction: Secondary | ICD-10-CM

## 2021-06-17 MED ORDER — TESTOSTERONE CYPIONATE 200 MG/ML IM SOLN: 200.0000 mg | INTRAMUSCULAR | 0 refills | Status: DC

## 2021-06-19 ENCOUNTER — Encounter: Payer: Self-pay | Admitting: Family Medicine

## 2021-07-30 ENCOUNTER — Ambulatory Visit: Payer: BC Managed Care – PPO | Admitting: Family Medicine

## 2021-07-30 ENCOUNTER — Encounter: Payer: Self-pay | Admitting: Family Medicine

## 2021-07-30 VITALS — BP 128/86 | HR 76 | Resp 16 | Ht 72.0 in | Wt 263.1 lb

## 2021-07-30 DIAGNOSIS — E782 Mixed hyperlipidemia: Secondary | ICD-10-CM

## 2021-07-30 DIAGNOSIS — J301 Allergic rhinitis due to pollen: Secondary | ICD-10-CM | POA: Diagnosis not present

## 2021-07-30 DIAGNOSIS — E291 Testicular hypofunction: Secondary | ICD-10-CM | POA: Diagnosis not present

## 2021-07-30 MED ORDER — MONTELUKAST SODIUM 10 MG PO TABS: ORAL_TABLET | ORAL | 1 refills | Status: DC

## 2021-07-30 NOTE — Progress Notes (Signed)
     I,Joseline E Rosas,acting as a scribe for [name], MD.,have documented all relevant documentation on the behalf of [name], MD,as directed by  [name], MD while in the presence of [name], MD.   Established patient visit   Patient: Adrian Hogan   DOB: 1975/04/10   46 y.o. Male  MRN: [id number] Visit Date: 07/30/2021  Today's healthcare provider: [name], MD   Chief Complaint  Patient presents with   Follow-up   Subjective    HPI  Follow-up Hypogonadism in male: Patient is here for medication refills. Feels current testosterone dose is effective, energy level and libido much bette. No adverse effects. Mood is stable . Last injection was bout 11 days ago.   Medications: Outpatient Medications Prior to Visit  Medication Sig   BD INTEGRA SYRINGE 25G X 1" 3 ML MISC USE AS DIRECTED FOR TESTOSTERONE INJECTIONS   Insulin Syringe-Needle U-100 25G X 1" 1 ML MISC Use as directed for testosterone injections   loratadine (CLARITIN) 10 MG tablet Take 10 mg by mouth daily.   montelukast (SINGULAIR) 10 MG tablet TAKE 1 TABLET BY MOUTH EVERYDAY AT BEDTIME   testosterone cypionate (DEPOTESTOSTERONE CYPIONATE) 200 MG/ML injection Inject 1 mL (200 mg total) into the muscle every 14 (fourteen) days.   No facility-administered medications prior to visit.    He also take montelukast along with OTC loratadine during the allergy seasons, which he states works well with no adverse effects.      Objective    BP 128/86 (BP Location: Left Arm, Patient Position: Sitting, Cuff Size: Normal)   Pulse 76   Resp 16   Ht 6' (1.829 m)   Wt 263 lb 1.6 oz (119.3 kg)   BMI 35.68 kg/m    Physical Exam   General appearance: Obese male, cooperative and in no acute distress Head: Normocephalic, without obvious abnormality, atraumatic Respiratory: Respirations even and unlabored, normal respiratory rate Extremities: All extremities are intact.  Skin: Skin color, texture,  turgor normal. No rashes seen  Psych: Appropriate mood and affect. Neurologic: Mental status: Alert, oriented to person, place, and time, thought content appropriate.     Assessment & Plan     1. Hypogonadism in male Doing well current testosterone injection regiment.  - Testosterone,Free and Total - CBC  2. Mixed hyperlipidemia Not really made any diet modifications.  - Comprehensive metabolic panel - Lipid panel  3. Seasonal allergic rhinitis due to pollen Well controlled with seasonal use of  montelukast (SINGULAIR) 10 MG tablet; TAKE 1 TABLET BY MOUTH EVERYDAY AT BEDTIME FOR ALLERGIES  Dispense: 90 tablet; Refill: 1      The entirety of the information documented in the History of Present Illness, Review of Systems and Physical Exam were personally obtained by me. Portions of this information were initially documented by the CMA and reviewed by me for thoroughness and accuracy. [name], MD  [facility] [phone] (phone) [phone] (fax)  [facility]

## 2021-08-01 ENCOUNTER — Encounter: Payer: Self-pay | Admitting: Family Medicine

## 2021-08-01 DIAGNOSIS — E291 Testicular hypofunction: Secondary | ICD-10-CM

## 2021-08-01 LAB — CBC
Hematocrit: 49.9 % (ref 37.5–51.0)
Hemoglobin: 16.8 g/dL (ref 13.0–17.7)
MCH: 30.3 pg (ref 26.6–33.0)
MCHC: 33.7 g/dL (ref 31.5–35.7)
MCV: 90 fL (ref 79–97)
Platelets: 190 10*3/uL (ref 150–450)
RBC: 5.54 x10E6/uL (ref 4.14–5.80)
RDW: 12.9 % (ref 11.6–15.4)
WBC: 7.3 10*3/uL (ref 3.4–10.8)

## 2021-08-01 LAB — COMPREHENSIVE METABOLIC PANEL
ALT: 40 IU/L (ref 0–44)
AST: 26 IU/L (ref 0–40)
Albumin/Globulin Ratio: 1.8 (ref 1.2–2.2)
Albumin: 4.4 g/dL (ref 4.0–5.0)
Alkaline Phosphatase: 107 IU/L (ref 44–121)
BUN/Creatinine Ratio: 14 (ref 9–20)
BUN: 17 mg/dL (ref 6–24)
Bilirubin Total: 0.4 mg/dL (ref 0.0–1.2)
CO2: 23 mmol/L (ref 20–29)
Calcium: 9.6 mg/dL (ref 8.7–10.2)
Chloride: 104 mmol/L (ref 96–106)
Creatinine, Ser: 1.24 mg/dL (ref 0.76–1.27)
Globulin, Total: 2.4 g/dL (ref 1.5–4.5)
Glucose: 103 mg/dL — ABNORMAL HIGH (ref 70–99)
Potassium: 4.5 mmol/L (ref 3.5–5.2)
Sodium: 142 mmol/L (ref 134–144)
Total Protein: 6.8 g/dL (ref 6.0–8.5)
eGFR: 74 mL/min/{1.73_m2} (ref >59)

## 2021-08-01 LAB — LIPID PANEL
Chol/HDL Ratio: 6.4 ratio — ABNORMAL HIGH (ref 0.0–5.0)
Cholesterol, Total: 231 mg/dL — ABNORMAL HIGH (ref 100–199)
HDL: 36 mg/dL — ABNORMAL LOW (ref >39)
LDL Chol Calc (NIH): 169 mg/dL — ABNORMAL HIGH (ref 0–99)
Triglycerides: 140 mg/dL (ref 0–149)
VLDL Cholesterol Cal: 26 mg/dL (ref 5–40)

## 2021-08-01 LAB — TESTOSTERONE,FREE AND TOTAL
Testosterone, Free: 8.6 pg/mL (ref 6.8–21.5)
Testosterone: 280 ng/dL (ref 264–916)

## 2021-08-06 MED ORDER — TESTOSTERONE CYPIONATE 200 MG/ML IM SOLN: 200.0000 mg | INTRAMUSCULAR | 1 refills | Status: DC

## 2021-08-06 NOTE — Telephone Encounter (Signed)
Last refill: 06/17/2021 disp: 17ml with 0 refills ? ?Last office visit: 07/30/2021 ?Next office visit: no future visit scheduled ?

## 2021-11-04 ENCOUNTER — Encounter: Payer: Self-pay | Admitting: Family Medicine

## 2021-11-05 ENCOUNTER — Ambulatory Visit: Payer: Self-pay

## 2021-11-05 ENCOUNTER — Ambulatory Visit: Payer: BC Managed Care – PPO | Admitting: Family Medicine

## 2021-11-05 ENCOUNTER — Encounter: Payer: Self-pay | Admitting: Family Medicine

## 2021-11-05 VITALS — BP 128/85 | HR 80 | Temp 98.0°F | Resp 16 | Wt 267.6 lb

## 2021-11-05 DIAGNOSIS — J069 Acute upper respiratory infection, unspecified: Secondary | ICD-10-CM

## 2021-11-05 MED ORDER — AZITHROMYCIN 250 MG PO TABS: ORAL_TABLET | ORAL | 0 refills | Status: AC

## 2021-11-05 NOTE — Progress Notes (Signed)
     I,[name],acting as a scribe for [name], MD.,have documented all relevant documentation on the behalf of [name], MD,as directed by  [name], MD while in the presence of [name], MD.   Established patient visit   Patient: Adrian Hogan   DOB: 1976-03-15   46 y.o. Male  MRN: [id number] Visit Date: 11/05/2021  Today's healthcare provider: [name], MD   Chief Complaint  Patient presents with   Cough   Subjective     Patient presents for cough with green sputum.   Onset 1 week ago.  Took Mucinex until Saturday when he ran out.  Reports he feels fine but states it may be going into his chest.    Medications: Outpatient Medications Prior to Visit  Medication Sig   BD INTEGRA SYRINGE 25G X 1" 3 ML MISC USE AS DIRECTED FOR TESTOSTERONE INJECTIONS   Insulin Syringe-Needle U-100 25G X 1" 1 ML MISC Use as directed for testosterone injections   loratadine (CLARITIN) 10 MG tablet Take 10 mg by mouth daily.   montelukast (SINGULAIR) 10 MG tablet TAKE 1 TABLET BY MOUTH EVERYDAY AT BEDTIME FOR ALLERGIES   testosterone cypionate (DEPOTESTOSTERONE CYPIONATE) 200 MG/ML injection Inject 1 mL (200 mg total) into the muscle every 14 (fourteen) days.   No facility-administered medications prior to visit.    Review of Systems  Respiratory:  Positive for cough.        Objective    BP 128/85 (BP Location: Left Arm, Patient Position: Sitting, Cuff Size: Large)   Pulse 80   Temp 98 F (36.7 C) (Oral)   Resp 16   Wt 267 lb 9.6 oz (121.4 kg)   SpO2 100%   BMI 36.29 kg/m    Physical Exam   General: Appearance:    Mildly obese male in no acute distress  Eyes:    PERRL, conjunctiva/corneas clear, EOM's intact       Lungs:     Clear to auscultation bilaterally, respirations unlabored  Heart:    Normal heart rate. Normal rhythm. No murmurs, rubs, or gallops.    MS:   All extremities are intact.    Neurologic:   Awake, alert, oriented x 3. No apparent  focal neurological defect.         Assessment & Plan     1. Upper respiratory tract infection, unspecified type  - azithromycin (ZITHROMAX) 250 MG tablet; Take 2 tablets on day 1, then 1 tablet daily on days 2 through 5  Dispense: 6 tablet; Refill: 0   Call if symptoms change or if not rapidly improving.        The entirety of the information documented in the History of Present Illness, Review of Systems and Physical Exam were personally obtained by me. Portions of this information were initially documented by the CMA and reviewed by me for thoroughness and accuracy.     [name], MD  [facility] [phone] (phone) [phone] (fax)  [facility]

## 2021-11-05 NOTE — Telephone Encounter (Signed)
Patient has appointment today

## 2021-11-05 NOTE — Telephone Encounter (Signed)
  Chief Complaint: [place] Si phlegm Symptoms: Coughing up green phlegm Frequency: 3 weeks Pertinent Negatives: Patient denies fever Disposition: [] ED /[] Urgent Care (no appt availability in office) / [x] Appointment(In office/virtual)/ []  [place] [facility]/ [] Home Care/ [] Refused Recommended Disposition /[] [place] [facility]/ []  Follow-up with PCP Additional Notes: PT states that he has had this for 3 weeks. He feels that it is trying to go into his chest.   Summary: Possible sinus infection?   The patient called in stating he believes he has a sinus infection and wants to know if something can be called in. He has had drainage with green mucus for about 2 weeks and a cough to go along with it. He prefers if something can be called in and not to come in for an appointment unless he has to. Please assist patient further.      Reason for Disposition  [1] Sinus congestion (pressure, fullness) AND [2] present > 10 days  Answer Assessment - Initial Assessment Questions 1. ONSET: "When did the nasal discharge start?"      3 weeks 2. AMOUNT: "How much discharge is there?"      3. COUGH: "Do you have a cough?" If Yes, ask: "Describe the color of your sputum" (clear, white, yellow, green)     Yes green 4. RESPIRATORY DISTRESS: "Describe your breathing."      ok 5. FEVER: "Do you have a fever?" If Yes, ask: "What is your temperature, how was it measured, and when did it start?"     no 6. SEVERITY: "Overall, how bad are you feeling right now?" (e.g., doesn't interfere with normal activities, staying home from school/work, staying in bed)      Working 7. OTHER SYMPTOMS: "Do you have any other symptoms?" (e.g., sore throat, earache, wheezing, vomiting)     Feels like it is going into his chest 8. PREGNANCY: "Is there any chance you are pregnant?" "When was your last menstrual period?"     na  Protocols used: Common Cold-A-AH

## 2021-11-19 ENCOUNTER — Ambulatory Visit: Payer: Self-pay

## 2021-11-19 MED ORDER — CEFDINIR 300 MG PO CAPS: 600.0000 mg | ORAL_CAPSULE | Freq: Every day | ORAL | 0 refills | Status: AC

## 2021-11-19 NOTE — Telephone Encounter (Signed)
  Chief Complaint: cough  Symptoms: lingering cough with green mucus  Frequency: 1 month  Pertinent Negatives: NA Disposition: [] ED /[] Urgent Care (no appt availability in office) / [] Appointment(In office/virtual)/ []  [facility]/ [] Home Care/ [] Refused Recommended Disposition /[] [place] [facility]/ [x]  Follow-up with PCP Additional Notes: pt had OV on 11/05/21 and was given zpak and still having lingering cough with green mucus. Pt states that Dr. said if that didn't help he could send something else in. Advised pt I would send to provider and CB if needed OV scheduled. Pt verbalized understanding.   Summary: Cough   Patient states he saw pcp on 11/05/21. Patient states he still has lingering cough.      Reason for Disposition  [1] Continuous (nonstop) coughing interferes with work or school AND [2] no improvement using cough treatment per Care Advice  Answer Assessment - Initial Assessment Questions 1. ONSET: "When did the cough begin?"      1 month  3. SPUTUM: "Describe the color of your sputum" (none, dry cough; clear, white, yellow, green)     Green mucus  5. DIFFICULTY BREATHING: "Are you having difficulty breathing?" If Yes, ask: "How bad is it?" (e.g., mild, moderate, severe)    - MILD: No SOB at rest, mild SOB with walking, speaks normally in sentences, can lie down, no retractions, pulse < 100.    - MODERATE: SOB at rest, SOB with minimal exertion and prefers to sit, cannot lie down flat, speaks in phrases, mild retractions, audible wheezing, pulse 100-120.    - SEVERE: Very SOB at rest, speaks in single words, struggling to breathe, sitting hunched forward, retractions, pulse > 120      no 6. FEVER: "Do you have a fever?" If Yes, ask: "What is your temperature, how was it measured, and when did it start?"     no 10. OTHER SYMPTOMS: "Do you have any other symptoms?" (e.g., runny nose, wheezing, chest pain)       Lingering cough  Protocols used: Cough -  Acute Productive-A-AH

## 2021-11-19 NOTE — Addendum Note (Signed)
Addended by: Malva Limes on: 11/19/2021 04:58 PM   Modules accepted: Orders

## 2021-12-10 ENCOUNTER — Other Ambulatory Visit: Payer: Self-pay | Admitting: Family Medicine

## 2021-12-10 NOTE — Telephone Encounter (Signed)
Medication Refill - Medication: BD INTEGRA SYRINGE 25G X 1" 3 ML MISC  Has the patient contacted their pharmacy? Yes.     Preferred Pharmacy (with phone number or street name):  [id number] [place] [address] Phone:  [phone]  Fax:  [phone]     Has the patient been seen for an appointment in the last year OR does the patient have an upcoming appointment? Yes.

## 2021-12-11 MED ORDER — "BD INTEGRA SYRINGE 25G X 1"" 3 ML MISC": 1 refills | Status: AC

## 2021-12-11 NOTE — Telephone Encounter (Signed)
Requested medication (s) are due for refill today - expired Rx  Requested medication (s) are on the active medication list -yes  Future visit scheduled -no  Last refill: 09/12/20 #25 1RF  Notes to clinic: expired Rx, no protocol listed  Requested Prescriptions  Pending Prescriptions Disp Refills   SYRINGE-NEEDLE, DISP, 3 ML (BD INTEGRA SYRINGE) 25G X 1" 3 ML MISC 25 each 1     There is no refill protocol information for this order       Requested Prescriptions  Pending Prescriptions Disp Refills   SYRINGE-NEEDLE, DISP, 3 ML (BD INTEGRA SYRINGE) 25G X 1" 3 ML MISC 25 each 1     There is no refill protocol information for this order

## 2022-01-30 ENCOUNTER — Other Ambulatory Visit: Payer: Self-pay | Admitting: Family Medicine

## 2022-01-30 DIAGNOSIS — J301 Allergic rhinitis due to pollen: Secondary | ICD-10-CM

## 2022-04-08 NOTE — Progress Notes (Unsigned)
    MyChart Video Visit    Virtual Visit via Video Note   This format is felt to be most appropriate for this patient at this time. Physical exam was limited by quality of the video and audio technology used for the visit.   Patient location: *** Provider location: [facility]  I discussed the limitations of evaluation and management by telemedicine and the availability of in person appointments. The patient expressed understanding and agreed to proceed.  Patient: Adrian Hogan   DOB: Jul 28, 1975   47 y.o. Male  MRN: [id number] Visit Date: 04/09/2022  Today's healthcare provider: Lelon Huh, MD   No chief complaint on file.  Subjective    HPI  ***   Medications: Outpatient Medications Prior to Visit  Medication Sig   Insulin Syringe-Needle U-100 25G X 1" 1 ML MISC Use as directed for testosterone injections   loratadine (CLARITIN) 10 MG tablet Take 10 mg by mouth daily.   montelukast (SINGULAIR) 10 MG tablet TAKE 1 TABLET BY MOUTH EVERYDAY AT BEDTIME FOR ALLERGIES   SYRINGE-NEEDLE, DISP, 3 ML (BD INTEGRA SYRINGE) 25G X 1" 3 ML MISC USE AS DIRECTED FOR TESTOSTERONE INJECTIONS   testosterone cypionate (DEPOTESTOSTERONE CYPIONATE) 200 MG/ML injection Inject 1 mL (200 mg total) into the muscle every 14 (fourteen) days.   No facility-administered medications prior to visit.    Review of Systems  {Labs  Heme  Chem  Endocrine  Serology  Results Review (optional):23779}   Objective    There were no vitals taken for this visit.  {Show previous vital signs (optional):23777}   Physical Exam     Assessment & Plan     ***  No follow-ups on file.     I discussed the assessment and treatment plan with the patient. The patient was provided an opportunity to ask questions and all were answered. The patient agreed with the plan and demonstrated an understanding of the instructions.   The patient was advised to call back or seek an in-person evaluation  if the symptoms worsen or if the condition fails to improve as anticipated.  I provided *** minutes of non-face-to-face time during this encounter.  {provider attestation***:1}  Lelon Huh, MD [facility] [phone] (phone) [phone] (fax)  [place]

## 2022-04-09 ENCOUNTER — Telehealth (INDEPENDENT_AMBULATORY_CARE_PROVIDER_SITE_OTHER): Payer: BC Managed Care – PPO | Admitting: Family Medicine

## 2022-04-09 DIAGNOSIS — J069 Acute upper respiratory infection, unspecified: Secondary | ICD-10-CM | POA: Diagnosis not present

## 2022-04-24 ENCOUNTER — Encounter: Payer: Self-pay | Admitting: Family Medicine

## 2022-04-25 MED ORDER — AZITHROMYCIN 250 MG PO TABS: ORAL_TABLET | ORAL | 0 refills | Status: AC

## 2022-05-15 MED ORDER — AMOXICILLIN 500 MG PO CAPS: 1000.0000 mg | ORAL_CAPSULE | Freq: Three times a day (TID) | ORAL | 0 refills | Status: AC

## 2022-05-15 NOTE — Addendum Note (Signed)
Addended by: Birdie Sons on: 05/15/2022 01:09 PM   Modules accepted: Orders

## 2022-07-02 ENCOUNTER — Other Ambulatory Visit: Payer: Self-pay | Admitting: Family Medicine

## 2022-07-02 DIAGNOSIS — E291 Testicular hypofunction: Secondary | ICD-10-CM

## 2022-07-02 NOTE — Telephone Encounter (Signed)
Requested medication (s) are due for refill today - yes  Requested medication (s) are on the active medication list -yes  Future visit scheduled -no  Last refill: 08/06/21 24ml 1RF  Notes to clinic: off protocol- provider review   Requested Prescriptions  Pending Prescriptions Disp Refills   testosterone cypionate (DEPOTESTOSTERONE CYPIONATE) 200 MG/ML injection [Pharmacy Med Name: TESTOSTERONE CYP 200 MG/ML] 1 mL     Sig: INJECT 1 ML (200 MG TOTAL) INTO THE MUSCLE EVERY 14 DAYS *NEED TO SCHEDULE OFFICE VISIT*     Off-Protocol Failed - 07/02/2022 10:37 AM      Failed - Medication not assigned to a protocol, review manually.      Passed - Valid encounter within last 12 months    Recent Outpatient Visits           2 months ago Viral upper respiratory tract infection   [facility] Malva Limes, MD   7 months ago Upper respiratory tract infection, unspecified type   [facility] Malva Limes, MD   11 months ago Hypogonadism in male   [facility] Malva Limes, MD   2 years ago Hypogonadism in male   [facility] Sherrie Mustache, Demetrios Isaacs, MD   3 years ago Annual physical exam   [facility] Malva Limes, MD                 Requested Prescriptions  Pending Prescriptions Disp Refills   testosterone cypionate (DEPOTESTOSTERONE CYPIONATE) 200 MG/ML injection [Pharmacy Med Name: TESTOSTERONE CYP 200 MG/ML] 1 mL     Sig: INJECT 1 ML (200 MG TOTAL) INTO THE MUSCLE EVERY 14 DAYS *NEED TO SCHEDULE OFFICE VISIT*     Off-Protocol Failed - 07/02/2022 10:37 AM      Failed - Medication not assigned to a protocol, review manually.      Passed - Valid encounter within last 12 months    Recent Outpatient Visits           2 months ago Viral upper respiratory tract infection   [facility] Malva Limes, MD   7 months ago  Upper respiratory tract infection, unspecified type   [facility] Malva Limes, MD   11 months ago Hypogonadism in male   [facility] Malva Limes, MD   2 years ago Hypogonadism in male   [facility] Malva Limes, MD   3 years ago Annual physical exam   [facility] Malva Limes, MD

## 2022-07-03 ENCOUNTER — Other Ambulatory Visit: Payer: Self-pay | Admitting: Unknown Physician Specialty

## 2022-07-03 DIAGNOSIS — R1310 Dysphagia, unspecified: Secondary | ICD-10-CM

## 2022-07-05 ENCOUNTER — Other Ambulatory Visit: Payer: Self-pay | Admitting: Unknown Physician Specialty

## 2022-07-05 ENCOUNTER — Ambulatory Visit
Admission: RE | Admit: 2022-07-05 | Discharge: 2022-07-05 | Disposition: A | Discharge status: Home / Self Care | Payer: BC Managed Care – PPO | Source: Ambulatory Visit | Attending: Unknown Physician Specialty | Admitting: Unknown Physician Specialty

## 2022-07-05 DIAGNOSIS — R1310 Dysphagia, unspecified: Secondary | ICD-10-CM

## 2022-07-14 ENCOUNTER — Encounter: Payer: Self-pay | Admitting: Family Medicine

## 2022-07-14 DIAGNOSIS — E291 Testicular hypofunction: Secondary | ICD-10-CM

## 2022-07-15 ENCOUNTER — Ambulatory Visit: Payer: BC Managed Care – PPO | Admitting: Family Medicine

## 2022-07-15 VITALS — BP 129/75 | HR 65 | Ht 73.0 in | Wt 268.0 lb

## 2022-07-15 DIAGNOSIS — Z8349 Family history of other endocrine, nutritional and metabolic diseases: Secondary | ICD-10-CM

## 2022-07-15 DIAGNOSIS — E291 Testicular hypofunction: Secondary | ICD-10-CM | POA: Diagnosis not present

## 2022-07-15 NOTE — Progress Notes (Signed)
     Established patient visit   Patient: Adrian Hogan   DOB: February 21, 1976   47 y.o. Male  MRN: [id number] Visit Date: 07/15/2022  Today's healthcare provider: [name], MD    Subjective    HPI  Patient is present due to needing a refill on testosterone rx. Testosterone  Date Value Ref Range Status  07/30/2021 280 264 - 916 ng/dL Final  40/98/1191 478 295 - 916 ng/dL Final  62/13/0865 784 696 - 916 ng/dL Final   Testosterone, Free  Date Value Ref Range Status  07/30/2021 8.6 6.8 - 21.5 pg/mL Final  07/27/2020 16.4 6.8 - 21.5 pg/mL Final  04/06/2020 15.1 6.8 - 21.5 pg/mL Final    Medications: Outpatient Medications Prior to Visit  Medication Sig   Insulin Syringe-Needle U-100 25G X 1" 1 ML MISC Use as directed for testosterone injections   loratadine (CLARITIN) 10 MG tablet Take 10 mg by mouth daily.   montelukast (SINGULAIR) 10 MG tablet TAKE 1 TABLET BY MOUTH EVERYDAY AT BEDTIME FOR ALLERGIES   SYRINGE-NEEDLE, DISP, 3 ML (BD INTEGRA SYRINGE) 25G X 1" 3 ML MISC USE AS DIRECTED FOR TESTOSTERONE INJECTIONS   testosterone cypionate (DEPOTESTOSTERONE CYPIONATE) 200 MG/ML injection INJECT 1 ML (200 MG TOTAL) INTO THE MUSCLE EVERY 14 DAYS *NEED TO SCHEDULE OFFICE VISIT*   No facility-administered medications prior to visit.    Review of Systems  Constitutional:  Negative for appetite change, chills and fever.  Respiratory:  Negative for chest tightness, shortness of breath and wheezing.   Cardiovascular:  Negative for chest pain and palpitations.  Gastrointestinal:  Negative for abdominal pain, nausea and vomiting.       Objective    BP 129/75 (BP Location: Left Arm, Patient Position: Sitting, Cuff Size: Normal)   Pulse 65   Ht  (1.854 m)   Wt 268 lb (121.6 kg)   SpO2 98%   BMI 35.36 kg/m    Physical Exam  General appearance: Mildly obese male, cooperative and in no acute distress Head: Normocephalic, without obvious abnormality, atraumatic Respiratory:  Respirations even and unlabored, normal respiratory rate Extremities: All extremities are intact.  Skin: Skin color, texture, turgor normal. No rashes seen  Psych: Appropriate mood and affect. Neurologic: Mental status: Alert, oriented to person, place, and time, thought content appropriate.   Assessment & Plan     1. Hypogonadism in male Doing well with testosterone injections, although does not seem to be helping energy level quite as much as when he first started. His last injection was 2 weeks ago and is due for dose this evening.   - Testosterone,Free and Total - PSA - Hemoglobin and hematocrit, blood  2. Family history of hypothyroidism  - TSH      The entirety of the information documented in the History of Present Illness, Review of Systems and Physical Exam were personally obtained by me. Portions of this information were initially documented by the CMA and reviewed by me for thoroughness and accuracy.     [name], MD  [facility] [phone] (phone) [phone] (fax)  [facility]

## 2022-07-16 LAB — TESTOSTERONE,FREE AND TOTAL

## 2022-07-20 LAB — HEMOGLOBIN AND HEMATOCRIT, BLOOD
Hematocrit: 50.6 % (ref 37.5–51.0)
Hemoglobin: 16.2 g/dL (ref 13.0–17.7)

## 2022-07-20 LAB — TSH: TSH: 1.76 u[IU]/mL (ref 0.450–4.500)

## 2022-07-20 LAB — PSA: Prostate Specific Ag, Serum: 2 ng/mL (ref 0.0–4.0)

## 2022-07-20 LAB — TESTOSTERONE,FREE AND TOTAL: Testosterone: 394 ng/dL (ref 264–916)

## 2022-07-22 MED ORDER — TESTOSTERONE CYPIONATE 200 MG/ML IM SOLN: 150.0000 mg | INTRAMUSCULAR | 0 refills | Status: DC

## 2022-07-23 MED ORDER — TESTOSTERONE CYPIONATE 200 MG/ML IM SOLN: 300.0000 mg | INTRAMUSCULAR | 0 refills | Status: DC

## 2022-07-23 NOTE — Addendum Note (Signed)
Addended by: Malva Limes on: 07/23/2022 04:09 PM   Modules accepted: Orders

## 2022-08-13 NOTE — Progress Notes (Unsigned)
Midge Minium, MD [address]  [address]  [place], [place] [address]  Main: [phone]  Fax: [phone]    Gastroenterology Virtual/Video Visit  Referring Provider:     Malva Limes, MD Primary Care Physician:  Malva Limes, MD Primary Gastroenterologist:  Dr.Camaron Cammack Servando Snare Reason for Consultation:     Dysphagia        HPI:    Virtual Visit via Video Note Location of the patient: Work Location of provider: Home Office Participating persons: The patient and myself.  I connected with Adrian Hogan on 08/13/22 at  8:45 AM EDT by a video enabled telemedicine application and verified that I am speaking with the correct person using two identifiers.   I discussed the limitations of evaluation and management by telemedicine and the availability of in person appointments. The patient expressed understanding and agreed to proceed.  Verbal consent to proceed obtained.  History of Present Illness: Adrian Hogan is a 47 y.o. male referred by Dr. Malva Limes, MD  for consultation & management of dysphagia.  This patient had been seen in the past [facility] back in 2015 and appears to have had an upper endoscopy at that time for dysphagia.  The patient was then seen by me in 2020 for dysphagia.  At that time the patient had been reporting dysphagia that had been getting worse over the last few years and was seen by ENT and referred to see me.  The patient was recommended to be set up for a upper endoscopy to rule out an esophageal stricture and possible eosinophilic esophagitis.  At the time the patient was explained the differential diagnosis including Schatzki's ring, esophageal stricture, eosinophilic esophagitis and a motility disorder with a neoplasm being less likely.  It does not appear that the patient has had a upper endoscopy since then and his last upper endoscopy was by the [facility] back in 2015.  After being seen by ENT again this year the patient was  sent for a barium swallow last month that showed:  IMPRESSION: 1. Mild narrowing/stricture of the distal esophagus above a minimal hiatal hernia leading to transient holdup to passage of 13 mm barium tablet. 2. No other abnormalities.  No reflux documented during the exam.  Due to the above findings the patient was sent to me for evaluation for the esophageal narrowing and dysphagia.  The patient reports that he has never had a colonoscopy.  He also reports that his dysphagia is worse with solids more than it is with liquids.  Past Medical History:  Diagnosis Date   Empyema lung (HCC)    History of nasal surgery    Pneumonia 02/2014   Developed empyema requiried prolonged chest tube   Sleep apnea    RECENTLY DX AND DOES NOT HAVE CPAP YET    Past Surgical History:  Procedure Laterality Date   ESOPHAGOGASTRODUODENOSCOPY  02/2014   NASAL SEPTOPLASTY W/ TURBINOPLASTY Bilateral 04/28/2018   Procedure: SEPTOPLASTY WITH BILATERAL SUBMUCOSAL RESECTION TURBINATE;  Surgeon: Linus Salmons, MD;  Location: [facility] ORS;  Service: ENT;  Laterality: Bilateral;   THORACOTOMY  02/28/2014   VASECTOMY  2016    Prior to Admission medications   Medication Sig Start Date End Date Taking? Authorizing Provider  Insulin Syringe-Needle U-100 25G X 1" 1 ML MISC Use as directed for testosterone injections 08/02/19   Malva Limes, MD  loratadine (CLARITIN) 10 MG tablet Take 10 mg by mouth daily.  [provider]  montelukast (SINGULAIR) 10 MG tablet TAKE 1 TABLET BY MOUTH EVERYDAY AT BEDTIME FOR ALLERGIES 01/30/22   Malva Limes, MD  SYRINGE-NEEDLE, DISP, 3 ML (BD INTEGRA SYRINGE) 25G X 1" 3 ML MISC USE AS DIRECTED FOR TESTOSTERONE INJECTIONS 12/11/21   Malva Limes, MD  testosterone cypionate (DEPOTESTOSTERONE CYPIONATE) 200 MG/ML injection Inject 1.5 mLs (300 mg total) into the muscle every 14 (fourteen) days. 07/23/22   Malva Limes, MD    Family History  Problem Relation Age of  Onset   Graves' disease Brother    Arthritis Maternal Grandmother      Social History   Tobacco Use   Smoking status: Never   Smokeless tobacco: Never  Vaping Use   Vaping Use: Never used  Substance Use Topics   Alcohol use: No   Drug use: No    Allergies as of 08/14/2022   (No Known Allergies)    Review of Systems:    All systems reviewed and negative except where noted in HPI.   Observations/Objective:  Labs: CBC    Component Value Date/Time   WBC 7.3 07/30/2021 0849   WBC 10.3 03/06/2014 0737   RBC 5.54 07/30/2021 0849   RBC 3.58 (L) 03/06/2014 0737   HGB 16.2 07/15/2022 1532   HCT 50.6 07/15/2022 1532   PLT 190 07/30/2021 0849   MCV 90 07/30/2021 0849   MCV 86 03/06/2014 0737   MCH 30.3 07/30/2021 0849   MCH 27.9 03/06/2014 0737   MCHC 33.7 07/30/2021 0849   MCHC 32.5 03/06/2014 0737   RDW 12.9 07/30/2021 0849   RDW 14.2 03/06/2014 0737   LYMPHSABS 2.1 02/01/2020 0816   LYMPHSABS 2.3 03/06/2014 0737   MONOABS 0.9 03/06/2014 0737   EOSABS 0.5 (H) 02/01/2020 0816   EOSABS 0.5 03/06/2014 0737   BASOSABS 0.1 02/01/2020 0816   BASOSABS 0.1 03/06/2014 0737   CMP     Component Value Date/Time   NA 142 07/30/2021 0849   NA 137 03/10/2014 0911   K 4.5 07/30/2021 0849   K 3.9 03/10/2014 0911   CL 104 07/30/2021 0849   CL 101 03/10/2014 0911   CO2 23 07/30/2021 0849   CO2 30 03/10/2014 0911   GLUCOSE 103 (H) 07/30/2021 0849   GLUCOSE 124 (H) 03/10/2014 0911   BUN 17 07/30/2021 0849   BUN 10 03/10/2014 0911   CREATININE 1.24 07/30/2021 0849   CREATININE 2.00 (H) 03/10/2014 0911   CALCIUM 9.6 07/30/2021 0849   CALCIUM 9.5 03/10/2014 0911   PROT 6.8 07/30/2021 0849   PROT 6.3 (L) 03/01/2014 0429   ALBUMIN 4.4 07/30/2021 0849   ALBUMIN 2.7 (L) 03/10/2014 0911   AST 26 07/30/2021 0849   AST 23 03/01/2014 0429   ALT 40 07/30/2021 0849   ALT 21 03/01/2014 0429   ALKPHOS 107 07/30/2021 0849   ALKPHOS 101 03/01/2014 0429   BILITOT 0.4 07/30/2021 0849    BILITOT 0.2 03/01/2014 0429   GFRNONAA 76 07/30/2019 0837   GFRNONAA 40 (L) 03/10/2014 0911   GFRAA 87 07/30/2019 0837   GFRAA 49 (L) 03/10/2014 0911    Imaging Studies: No results found.  Assessment and Plan:   Adrian Hogan is a 47 y.o. y/o male has been referred for dysphagia and a barium swallow that showed a distal esophageal stricture.  The patient has also reached the age of 72 and has never had a colonoscopy.  The patient will be set up for a screening colonoscopy  and an upper endoscopy with possible dilation for dysphagia and an abnormal barium swallow showing a esophageal stricture.  The patient has been explained the plan and agrees with it.  Follow Up Instructions:  I discussed the assessment and treatment plan with the patient. The patient was provided an opportunity to ask questions and all were answered. The patient agreed with the plan and demonstrated an understanding of the instructions.   The patient was advised to call back or seek an in-person evaluation if the symptoms worsen or if the condition fails to improve as anticipated.  I provided 25 minutes of non-face-to-face time during this encounter including chart review In preparation for the encounter.   Midge Minium, MD  Speech recognition software was used to dictate the above note.

## 2022-08-14 ENCOUNTER — Telehealth: Payer: BC Managed Care – PPO | Admitting: Gastroenterology

## 2022-08-14 VITALS — Ht 73.0 in | Wt 268.0 lb

## 2022-08-14 DIAGNOSIS — K222 Esophageal obstruction: Secondary | ICD-10-CM | POA: Diagnosis not present

## 2022-08-14 DIAGNOSIS — R1319 Other dysphagia: Secondary | ICD-10-CM | POA: Diagnosis not present

## 2022-08-21 ENCOUNTER — Telehealth: Payer: Self-pay

## 2022-08-21 NOTE — Telephone Encounter (Signed)
-----   Message from Unknown Foley sent at 08/20/2022 10:32 AM EDT ----- Please review incoming fax

## 2022-08-21 NOTE — Telephone Encounter (Signed)
PA started

## 2022-08-22 NOTE — Telephone Encounter (Signed)
Approved today Your PA request has been approved. Additional information will be provided in the approval communication. (Message 1145) Authorization Expiration Date: 08/20/2025 [facility]

## 2022-08-26 ENCOUNTER — Telehealth: Payer: Self-pay | Admitting: Family Medicine

## 2022-08-26 DIAGNOSIS — E291 Testicular hypofunction: Secondary | ICD-10-CM

## 2022-08-26 NOTE — Telephone Encounter (Signed)
Patient states that pharmacy did not receive rx for testosterone cypionate (DEPOTESTOSTERONE CYPIONATE) 200 MG/ML injection 1.5 ml every 14 days. Patient states the pharmacy is still giving him .75 ml. Please advise.

## 2022-08-27 MED ORDER — TESTOSTERONE CYPIONATE 200 MG/ML IM SOLN: 300.0000 mg | INTRAMUSCULAR | 0 refills | Status: DC

## 2022-08-27 NOTE — Telephone Encounter (Signed)
Have resent prescription with those directions.

## 2022-09-05 ENCOUNTER — Other Ambulatory Visit: Payer: Self-pay | Admitting: Family Medicine

## 2022-09-05 DIAGNOSIS — E291 Testicular hypofunction: Secondary | ICD-10-CM

## 2022-09-05 NOTE — Telephone Encounter (Signed)
[facility] that [facility] for testosterone cypionate (DEPOTESTOSTERONE CYPIONATE) 200 MG/ML injection . [facility]end [facility]  [id number] [place] [place] - [address] Phone: [phone]  Fax: [phone]

## 2022-09-06 MED ORDER — TESTOSTERONE CYPIONATE 200 MG/ML IM SOLN: 300.0000 mg | INTRAMUSCULAR | 1 refills | Status: DC

## 2022-09-06 NOTE — Telephone Encounter (Signed)
Requested medication (s) are due for refill today: yes  Requested medication (s) are on the active medication list: yes  Last refill:  08/27/22  Future visit scheduled: no  Notes to clinic:  Medication not assigned to a protocol, review manually. Pharmacy did not receive previous request, please resend to pharmacy.     Requested Prescriptions  Pending Prescriptions Disp Refills   testosterone cypionate (DEPOTESTOSTERONE CYPIONATE) 200 MG/ML injection 10 mL 0    Sig: Inject 1.5 mLs (300 mg total) into the muscle every 14 (fourteen) days.     Off-Protocol Failed - 09/05/2022  3:01 PM      Failed - Medication not assigned to a protocol, review manually.      Passed - Valid encounter within last 12 months    Recent Outpatient Visits           1 month ago Hypogonadism in male   [facility] Malva Limes, MD   5 months ago Viral upper respiratory tract infection   [facility] Malva Limes, MD   10 months ago Upper respiratory tract infection, unspecified type   [facility] Malva Limes, MD   1 year ago Hypogonadism in male   [facility] Malva Limes, MD   3 years ago Hypogonadism in male   [facility] Sherrie Mustache, Demetrios Isaacs, MD

## 2023-02-03 ENCOUNTER — Other Ambulatory Visit: Payer: Self-pay | Admitting: Family Medicine

## 2023-02-03 DIAGNOSIS — J301 Allergic rhinitis due to pollen: Secondary | ICD-10-CM

## 2023-02-04 NOTE — Telephone Encounter (Signed)
Requested Prescriptions  Pending Prescriptions Disp Refills   montelukast (SINGULAIR) 10 MG tablet [Pharmacy Med Name: MONTELUKAST SOD 10 MG TABLET] 90 tablet 4    Sig: TAKE 1 TABLET BY MOUTH EVERYDAY AT BEDTIME FOR ALLERGIES     Pulmonology:  Leukotriene Inhibitors Passed - 02/03/2023  1:19 AM      Passed - Valid encounter within last 12 months    Recent Outpatient Visits           6 months ago Hypogonadism in male   [facility] Malva Limes, MD   10 months ago Viral upper respiratory tract infection   [facility] Malva Limes, MD   1 year ago Upper respiratory tract infection, unspecified type   [facility] Malva Limes, MD   1 year ago Hypogonadism in male   [facility] Malva Limes, MD   3 years ago Hypogonadism in male   [facility] Sherrie Mustache, Demetrios Isaacs, MD

## 2023-05-01 ENCOUNTER — Other Ambulatory Visit: Payer: Self-pay | Admitting: Family Medicine

## 2023-05-01 DIAGNOSIS — E291 Testicular hypofunction: Secondary | ICD-10-CM

## 2023-12-01 ENCOUNTER — Other Ambulatory Visit: Payer: Self-pay | Admitting: Family Medicine

## 2023-12-01 DIAGNOSIS — E291 Testicular hypofunction: Secondary | ICD-10-CM

## 2024-03-11 ENCOUNTER — Other Ambulatory Visit: Payer: Self-pay | Admitting: Family Medicine

## 2024-03-11 DIAGNOSIS — E291 Testicular hypofunction: Secondary | ICD-10-CM

## 2024-03-12 NOTE — Telephone Encounter (Signed)
 Patient not seen in over a year and half. He needs to schedule appointment before refill can be approved.

## 2024-03-31 ENCOUNTER — Telehealth: Payer: Self-pay

## 2024-03-31 DIAGNOSIS — E785 Hyperlipidemia, unspecified: Secondary | ICD-10-CM

## 2024-03-31 DIAGNOSIS — E291 Testicular hypofunction: Secondary | ICD-10-CM

## 2024-03-31 DIAGNOSIS — Z125 Encounter for screening for malignant neoplasm of prostate: Secondary | ICD-10-CM

## 2024-03-31 NOTE — Telephone Encounter (Signed)
 Copied from CRM [id number]. Topic: Clinical - Lab/Test Results >> Mar 31, 2024  9:35 AM Tonda B wrote: Reason for CRM: patient is wanting a lab order he would like to do the lab before his appointment 04/07/2024 please call pt back [phone]

## 2024-03-31 NOTE — Telephone Encounter (Signed)
 Order placed, blood should be drawn in a.m. preferably fasting.

## 2024-04-01 NOTE — Telephone Encounter (Signed)
 Advised

## 2024-04-03 ENCOUNTER — Other Ambulatory Visit: Payer: Self-pay | Admitting: Family Medicine

## 2024-04-03 DIAGNOSIS — J301 Allergic rhinitis due to pollen: Secondary | ICD-10-CM

## 2024-04-06 ENCOUNTER — Ambulatory Visit: Payer: Self-pay | Admitting: Family Medicine

## 2024-04-06 DIAGNOSIS — E291 Testicular hypofunction: Secondary | ICD-10-CM

## 2024-04-07 ENCOUNTER — Ambulatory Visit: Payer: Self-pay | Admitting: Family Medicine

## 2024-04-07 ENCOUNTER — Encounter: Payer: Self-pay | Admitting: Family Medicine

## 2024-04-07 VITALS — BP 145/88 | HR 82 | Resp 14 | Ht 73.0 in | Wt 278.4 lb

## 2024-04-07 DIAGNOSIS — G4733 Obstructive sleep apnea (adult) (pediatric): Secondary | ICD-10-CM

## 2024-04-07 DIAGNOSIS — E291 Testicular hypofunction: Secondary | ICD-10-CM

## 2024-04-07 DIAGNOSIS — Z1211 Encounter for screening for malignant neoplasm of colon: Secondary | ICD-10-CM | POA: Diagnosis not present

## 2024-04-07 DIAGNOSIS — E785 Hyperlipidemia, unspecified: Secondary | ICD-10-CM

## 2024-04-07 DIAGNOSIS — Z23 Encounter for immunization: Secondary | ICD-10-CM

## 2024-04-07 LAB — COMPREHENSIVE METABOLIC PANEL WITH GFR
ALT: 44 IU/L (ref 0–44)
AST: 37 IU/L (ref 0–40)
Albumin: 4.6 g/dL (ref 4.1–5.1)
Alkaline Phosphatase: 102 IU/L (ref 47–123)
BUN/Creatinine Ratio: 13 (ref 9–20)
BUN: 18 mg/dL (ref 6–24)
Bilirubin Total: 0.7 mg/dL (ref 0.0–1.2)
CO2: 24 mmol/L (ref 20–29)
Calcium: 9.1 mg/dL (ref 8.7–10.2)
Chloride: 103 mmol/L (ref 96–106)
Creatinine, Ser: 1.41 mg/dL — ABNORMAL HIGH (ref 0.76–1.27)
Globulin, Total: 2.5 g/dL (ref 1.5–4.5)
Glucose: 105 mg/dL — ABNORMAL HIGH (ref 70–99)
Potassium: 4.8 mmol/L (ref 3.5–5.2)
Sodium: 142 mmol/L (ref 134–144)
Total Protein: 7.1 g/dL (ref 6.0–8.5)
eGFR: 62 mL/min/1.73

## 2024-04-07 LAB — TESTOSTERONE,FREE AND TOTAL
Testosterone, Free: 29 pg/mL — AB (ref 6.8–21.5)
Testosterone: 755 ng/dL (ref 264–916)

## 2024-04-07 LAB — LIPID PANEL
Chol/HDL Ratio: 6.9 ratio — ABNORMAL HIGH (ref 0.0–5.0)
Cholesterol, Total: 220 mg/dL — ABNORMAL HIGH (ref 100–199)
HDL: 32 mg/dL — ABNORMAL LOW
LDL Chol Calc (NIH): 156 mg/dL — ABNORMAL HIGH (ref 0–99)
Triglycerides: 175 mg/dL — ABNORMAL HIGH (ref 0–149)
VLDL Cholesterol Cal: 32 mg/dL (ref 5–40)

## 2024-04-07 LAB — CBC
Hematocrit: 49.9 % (ref 37.5–51.0)
Hemoglobin: 16.7 g/dL (ref 13.0–17.7)
MCH: 29.8 pg (ref 26.6–33.0)
MCHC: 33.5 g/dL (ref 31.5–35.7)
MCV: 89 fL (ref 79–97)
Platelets: 212 x10E3/uL (ref 150–450)
RBC: 5.6 x10E6/uL (ref 4.14–5.80)
RDW: 13.8 % (ref 11.6–15.4)
WBC: 6 x10E3/uL (ref 3.4–10.8)

## 2024-04-07 LAB — PSA TOTAL (REFLEX TO FREE): Prostate Specific Ag, Serum: 1.5 ng/mL (ref 0.0–4.0)

## 2024-04-07 NOTE — Progress Notes (Signed)
 "     Established patient visit   Patient: Adrian Hogan   DOB: November 30, 1975   49 y.o. Male  MRN: [id number] Visit Date: 04/07/2024  Today's healthcare provider: Nancyann Perry, MD   Chief Complaint  Patient presents with   Medication Refill    Testosterone  med. Last OV 07/15/2022   Subjective    Medication Refill Pertinent negatives include no abdominal pain, chest pain, chills, fever, nausea or vomiting.   Follow up hypogonadism. Feels well, but somewhat fatigued as day goes by. Using CPAP every night per Dr. Herminio and feels much better rested since starting it. Feels more energetic since being on testosterone . Taking 1.62ml x 200mg /ml Q14 days. 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    Lab Results  Component Value Date   TESTOSTERONE  755 04/05/2024  Drawn 8 days after most recent injection.   Lab Results  Component Value Date   CHOL 220 (H) 04/05/2024   HDL 32 (L) 04/05/2024   LDLCALC 156 (H) 04/05/2024   TRIG 175 (H) 04/05/2024   CHOLHDL 6.9 (H) 04/05/2024   The 10-year ASCVD risk score (Arnett DK, et al., 2019) is: 6.1%   Medications: Show/hide medication list[1]  Review of Systems  Constitutional:  Negative for appetite change, chills and fever.  Respiratory:  Negative for chest tightness, shortness of breath and wheezing.   Cardiovascular:  Negative for chest pain and palpitations.  Gastrointestinal:  Negative for abdominal pain, nausea and vomiting.       Objective    BP (!) 145/88   Pulse 82   Resp 14   Ht 6' 1 (1.854 m)   Wt 278 lb 6.4 oz (126.3 kg)   SpO2 99%   BMI 36.73 kg/m    Physical Exam   General appearance: Mildly obese male, cooperative and in no acute distress Head: Normocephalic, without obvious abnormality, atraumatic Respiratory: Respirations even and unlabored, normal respiratory rate Extremities: All extremities are intact.  Skin: Skin color, texture, turgor normal. No rashes seen  Psych: Appropriate mood and affect. Neurologic:  Mental status: Alert, oriented to person, place, and time, thought content appropriate.    Assessment & Plan     1. Hypogonadism in male (Primary) Feels well on current testosterone  replacement, although still a bit fatigued. Awaiting results of Free Testosterone . Needs refilled before end of week.   2. OSA on CPAP Feels much better since starting on CPAP per Dr .Herminio  3. Hyperlipidemia, unspecified hyperlipidemia type Not quite to statin threshold. Continue working on [employer].   4. Colon cancer screening  - Ambulatory referral to Gastroenterology   Return in about 6 months (around 10/05/2024) for testosterone .         Nancyann Perry, MD  [facility] [phone] (phone) [phone] (fax)  [facility]     [1]  Outpatient Medications Prior to Visit  Medication Sig   Insulin  Syringe-Needle U-100 25G X 1 1 ML MISC Use as directed for testosterone  injections   loratadine (CLARITIN) 10 MG tablet Take 10 mg by mouth daily. (Patient taking differently: Take 10 mg by mouth as needed.)   montelukast  (SINGULAIR ) 10 MG tablet TAKE 1 TABLET BY MOUTH EVERYDAY AT BEDTIME FOR ALLERGIES (Patient taking differently: Take 10 mg by mouth as needed. TAKE 1 TABLET BY MOUTH EVERYDAY AT BEDTIME FOR ALLERGIES)   SYRINGE-NEEDLE, DISP, 3 ML (BD INTEGRA SYRINGE) 25G X 1 3 ML MISC USE AS DIRECTED FOR TESTOSTERONE  INJECTIONS   testosterone  cypionate (DEPOTESTOSTERONE CYPIONATE) 200  MG/ML injection INJECT 1.5 MLS (300 MG TOTAL) INTO THE MUSCLE EVERY 14 (FOURTEEN) DAYS.   No facility-administered medications prior to visit.   "

## 2024-04-07 NOTE — Patient Instructions (Addendum)
 Please review the attached list of medications and notify my office if there are any errors.   Call [facility] at [phone] to schedule a colonoscopy  You are due for a Tdap (tetanus-diptheria-pertussis vaccine) which protects you from tetanus and whooping cough. Please check with your insurance plan or pharmacy regarding coverage for this vaccine.

## 2024-04-14 MED ORDER — TESTOSTERONE CYPIONATE 200 MG/ML IM SOLN: 300.0000 mg | INTRAMUSCULAR | 5 refills | Status: AC

## 2024-10-06 ENCOUNTER — Ambulatory Visit: Admitting: Family Medicine
